# Patient Record
Sex: Female | Born: 1990 | Race: Black or African American | Hispanic: No | Marital: Single | Smoking: Never smoker
Health system: Southern US, Community
[De-identification: ages and names within clinical notes are randomized; demographics above are authoritative.]

## PROBLEM LIST (undated history)

## (undated) DIAGNOSIS — J45909 Unspecified asthma, uncomplicated: Secondary | ICD-10-CM

---

## 2020-12-22 ENCOUNTER — Emergency Department (HOSPITAL_COMMUNITY)
Admission: EM | Admit: 2020-12-22 | Discharge: 2020-12-23 | Disposition: A | Discharge status: Home / Self Care | Payer: Medicaid - Out of State | Attending: Emergency Medicine | Admitting: Emergency Medicine

## 2020-12-22 ENCOUNTER — Encounter (HOSPITAL_COMMUNITY): Payer: Self-pay

## 2020-12-22 ENCOUNTER — Emergency Department (HOSPITAL_COMMUNITY): Payer: Medicaid - Out of State

## 2020-12-22 DIAGNOSIS — J45909 Unspecified asthma, uncomplicated: Secondary | ICD-10-CM | POA: Insufficient documentation

## 2020-12-22 DIAGNOSIS — K769 Liver disease, unspecified: Secondary | ICD-10-CM

## 2020-12-22 DIAGNOSIS — R1031 Right lower quadrant pain: Secondary | ICD-10-CM

## 2020-12-22 HISTORY — DX: Unspecified asthma, uncomplicated: J45.909

## 2020-12-22 LAB — URINALYSIS, ROUTINE W REFLEX MICROSCOPIC
Bacteria, UA: NONE SEEN
Bilirubin Urine: NEGATIVE
Glucose, UA: NEGATIVE mg/dL
Ketones, ur: NEGATIVE mg/dL
Nitrite: NEGATIVE
Protein, ur: 100 mg/dL — AB
RBC / HPF: >50 RBC/hpf — ABNORMAL HIGH (ref 0–5)
Specific Gravity, Urine: 1.017 (ref 1.005–1.030)
pH: 6 (ref 5.0–8.0)

## 2020-12-22 LAB — CBC
HCT: 34.2 % — ABNORMAL LOW (ref 36.0–46.0)
Hemoglobin: 11 g/dL — ABNORMAL LOW (ref 12.0–15.0)
MCH: 26.4 pg (ref 26.0–34.0)
MCHC: 32.2 g/dL (ref 30.0–36.0)
MCV: 82.2 fL (ref 80.0–100.0)
Platelets: 293 10*3/uL (ref 150–400)
RBC: 4.16 MIL/uL (ref 3.87–5.11)
RDW: 15.8 % — ABNORMAL HIGH (ref 11.5–15.5)
WBC: 6.6 10*3/uL (ref 4.0–10.5)
nRBC: 0 % (ref 0.0–0.2)

## 2020-12-22 LAB — COMPREHENSIVE METABOLIC PANEL
ALT: 24 U/L (ref 0–44)
AST: 17 U/L (ref 15–41)
Albumin: 3.3 g/dL — ABNORMAL LOW (ref 3.5–5.0)
Alkaline Phosphatase: 76 U/L (ref 38–126)
Anion gap: 7 (ref 5–15)
BUN: 12 mg/dL (ref 6–20)
CO2: 26 mmol/L (ref 22–32)
Calcium: 8.8 mg/dL — ABNORMAL LOW (ref 8.9–10.3)
Chloride: 106 mmol/L (ref 98–111)
Creatinine, Ser: 0.65 mg/dL (ref 0.44–1.00)
GFR, Estimated: >60 mL/min (ref >60)
Glucose, Bld: 96 mg/dL (ref 70–99)
Potassium: 3.7 mmol/L (ref 3.5–5.1)
Sodium: 139 mmol/L (ref 135–145)
Total Bilirubin: 0.4 mg/dL (ref 0.3–1.2)
Total Protein: 7.2 g/dL (ref 6.5–8.1)

## 2020-12-22 LAB — LIPASE, BLOOD: Lipase: 30 U/L (ref 11–51)

## 2020-12-22 LAB — I-STAT BETA HCG BLOOD, ED (MC, WL, AP ONLY): I-stat hCG, quantitative: <5 m[IU]/mL (ref <5)

## 2020-12-22 MED ORDER — IOHEXOL 300 MG/ML  SOLN
100.0000 mL | Freq: Once | INTRAMUSCULAR | Status: AC | PRN
  Administered 2020-12-22: 100 mL via INTRAVENOUS

## 2020-12-22 MED ORDER — SODIUM CHLORIDE 0.9 % IV BOLUS
1000.0000 mL | Freq: Once | INTRAVENOUS | Status: AC
  Administered 2020-12-22: 1000 mL via INTRAVENOUS

## 2020-12-22 MED ORDER — ONDANSETRON HCL 4 MG/2ML IJ SOLN
4.0000 mg | Freq: Once | INTRAMUSCULAR | Status: AC
  Administered 2020-12-22: 4 mg via INTRAVENOUS
  Filled 2020-12-22: qty 2

## 2020-12-22 MED ORDER — MORPHINE SULFATE (PF) 4 MG/ML IV SOLN
4.0000 mg | Freq: Once | INTRAVENOUS | Status: AC
  Administered 2020-12-22: 4 mg via INTRAVENOUS
  Filled 2020-12-22: qty 1

## 2020-12-22 NOTE — ED Provider Notes (Signed)
[facility]   CSN: [id number] Arrival date & time: 12/22/20  2131     History Chief Complaint  Patient presents with   Abdominal Pain    Krista Haney is a 30 y.o. female with no significant past medical history who presents for evaluation of abdominal pain.  Pain began 5 days ago.  Describes it as an aching, stabbing pain.  Pain worse with movement.  Pain is located to right upper quadrant, right lower quadrant.  Lower > upper abd pain. Has had some nausea without vomiting.  Recently lost 80 pounds by dieting and attempt to qualify for bariatric surgery.  She recently moved here from [place] and does not PCP follow-up.  States she has had persistent vaginal bleeding over the last month after she had an IUD placed.  She is sexually active with her significant other however and has no concerns for STDs, vaginal discharge.  She denies any pelvic pain.  No dysuria.  She rates her pain an 8.5/10.  She has been taking 1000 mg of ibuprofen twice daily for her pain.  She last took this yesterday.  She denies any prior abdominal surgeries.  Her last bowel movement was yesterday.  States she feels like she is "constipated."  She denies any melena or bright red blood per rectum.  States she typically has bowel movements 2-3 times daily.  Was able to eat a waffle at 9 PM tonight.  Pain does not radiate into her flanks.  She has never had any pain similar to this previously.  Denies additional aggravating or alleviating factors.  History obtained from patient and past medical records.  No interpreter used.      HPI     Past Medical History:  Diagnosis Date   Asthma     There are no problems to display for this patient.   History reviewed. No pertinent surgical history.   OB History   No obstetric history on file.     History reviewed. No pertinent family history.  Social History   Tobacco Use   Smoking status: Never Smoker    Smokeless tobacco: Never Used    Home Medications Prior to Admission medications   Medication Sig Start Date End Date Taking? Authorizing Provider  albuterol (VENTOLIN HFA) 108 (90 Base) MCG/ACT inhaler Inhale 2 puffs into the lungs every 6 (six) hours as needed for wheezing or shortness of breath.   Yes [provider]  dicyclomine (BENTYL) 20 MG tablet Take 1 tablet (20 mg total) by mouth 2 (two) times daily. 12/23/20  Yes Aniyia Rane A, PA-C  levonorgestrel (MIRENA) 20 MCG/24HR IUD 1 each by Intrauterine route once. Placed Jan 2022   Yes [provider]  Multiple Vitamin (MULTIVITAMIN) tablet Take 1 tablet by mouth daily. Centrum 1 a day   Yes [provider]    Allergies    Patient has no known allergies.  Review of Systems   Review of Systems  Constitutional: Positive for activity change and appetite change.  HENT: Negative.   Respiratory: Negative.   Cardiovascular: Negative.   Gastrointestinal: Positive for abdominal pain, constipation and nausea. Negative for anal bleeding, blood in stool, diarrhea, rectal pain and vomiting.  Genitourinary: Positive for vaginal bleeding (x 1 month). Negative for flank pain, frequency, hematuria, menstrual problem, pelvic pain, urgency, vaginal discharge and vaginal pain.  Musculoskeletal: Negative.   Skin: Negative.   Neurological: Negative.   All other systems reviewed and are negative.  Physical Exam Updated Vital Signs BP 127/77    Pulse 65    Temp 97.8 F (36.6 C) (Oral)    Resp 17    Ht 5\' 3"  (1.6 m)    Wt 131.1 kg    LMP 12/15/2020 Comment: negative beta HCG 12/22/20, IUD in place, constant bleeding   SpO2 100%    BMI 51.19 kg/m   Physical Exam Vitals and nursing note reviewed.  Constitutional:      General: She is not in acute distress.    Appearance: She is well-developed and well-nourished. She is not ill-appearing, toxic-appearing or diaphoretic.  HENT:     Head: Normocephalic and atraumatic.      Mouth/Throat:     Mouth: Mucous membranes are moist.  Eyes:     Pupils: Pupils are equal, round, and reactive to light.  Cardiovascular:     Rate and Rhythm: Normal rate.     Pulses: Intact distal pulses.     Heart sounds: Normal heart sounds.  Pulmonary:     Effort: Pulmonary effort is normal. No respiratory distress.     Breath sounds: Normal breath sounds.  Abdominal:     General: Bowel sounds are normal. There is no distension.     Palpations: Abdomen is soft.     Tenderness: There is abdominal tenderness in the right upper quadrant and right lower quadrant. There is no right CVA tenderness, left CVA tenderness, guarding or rebound. Negative signs include Murphy's sign and McBurney's sign.     Hernia: No hernia is present.  Genitourinary:    Comments: Normal appearing external female genitalia without rashes or lesions, normal vaginal epithelium. Normal appearing cervix without discharge or petechiae. Cervical os is closed. There is no bleeding noted at the os. No odor. Bimanual: No CMT, nontender.  No palpable adnexal masses or tenderness. Uterus midline and not fixed. IUD string visualized. Rectovaginal exam was deferred.  No cystocele or rectocele noted. No pelvic lymphadenopathy noted. Wet prep was obtained.  Cultures for gonorrhea and chlamydia collected. Exam performed with chaperone in room. Musculoskeletal:        General: Normal range of motion.     Cervical back: Normal range of motion.  Skin:    General: Skin is warm and dry.     Capillary Refill: Capillary refill takes less than 2 seconds.  Neurological:     General: No focal deficit present.     Mental Status: She is alert.  Psychiatric:        Mood and Affect: Mood and affect normal.     ED Results / Procedures / Treatments   Labs (all labs ordered are listed, but only abnormal results are displayed) Labs Reviewed  COMPREHENSIVE METABOLIC PANEL - Abnormal; Notable for the following components:      Result  Value   Calcium 8.8 (*)    Albumin 3.3 (*)    All other components within normal limits  CBC - Abnormal; Notable for the following components:   Hemoglobin 11.0 (*)    HCT 34.2 (*)    RDW 15.8 (*)    All other components within normal limits  URINALYSIS, ROUTINE W REFLEX MICROSCOPIC - Abnormal; Notable for the following components:   Color, Urine RED (*)    APPearance HAZY (*)    Hgb urine dipstick MODERATE (*)    Protein, ur 100 (*)    Leukocytes,Ua SMALL (*)    RBC / HPF >50 (*)    All other components within normal limits  WET PREP, GENITAL  LIPASE, BLOOD  I-STAT BETA HCG BLOOD, ED ([facility], [facility], [facility] ONLY)  GC/CHLAMYDIA PROBE AMP ([place]) NOT AT [facility]    EKG None  Radiology CT Abdomen Pelvis W Contrast  Result Date: 12/23/2020 CLINICAL DATA:  Right lower quadrant pain. EXAM: CT ABDOMEN AND PELVIS WITH CONTRAST TECHNIQUE: Multidetector CT imaging of the abdomen and pelvis was performed using the standard protocol following bolus administration of intravenous contrast. CONTRAST:  100mL OMNIPAQUE IOHEXOL 300 MG/ML  SOLN COMPARISON:  None. FINDINGS: Lower chest: No acute abnormality. Hepatobiliary: An ill-defined, approximately 2.2 cm x 1.7 cm area of heterogeneous low attenuation is seen within the anterolateral aspect of the right lobe of the liver (axial CT image 18, CT series number 2). A similar appearing 2.5 cm x 1.5 cm area of low attenuation is seen within the posterior aspect of the right lobe (axial CT image 15, CT series number 2). No gallstones, gallbladder wall thickening, or biliary dilatation. Pancreas: Unremarkable. No pancreatic ductal dilatation or surrounding inflammatory changes. Spleen: Normal in size without focal abnormality. Adrenals/Urinary Tract: Adrenal glands are unremarkable. Kidneys are normal, without renal calculi, focal lesion, or hydronephrosis. Bladder is unremarkable. Stomach/Bowel: Stomach is within normal limits. Appendix appears normal. No evidence of  bowel wall thickening, distention, or inflammatory changes. Vascular/Lymphatic: No significant vascular findings are present. No enlarged abdominal or pelvic lymph nodes. Reproductive: An IUD is seen within an otherwise normal appearing uterus. The bilateral adnexa are unremarkable. Other: No abdominal wall hernia or abnormality. No abdominopelvic ascites. Musculoskeletal: No acute or significant osseous findings. IMPRESSION: 1. Low attenuation liver lesions, as described above, which probably represent hepatic hemangiomas. MRI correlation is recommended. 2. IUD within an otherwise normal appearing uterus. Electronically Signed   By: Aram Candelahaddeus  Houston M.D.   On: 12/23/2020 00:37   US PELVIC COMPLETE W TRANSVAGINAL AND TORSION R/O  Result Date: 12/23/2020 CLINICAL DATA:  Right lower quadrant pain. EXAM: TRANSABDOMINAL AND TRANSVAGINAL ULTRASOUND OF PELVIS DOPPLER ULTRASOUND OF OVARIES TECHNIQUE: Both transabdominal and transvaginal ultrasound examinations of the pelvis were performed. Transabdominal technique was performed for global imaging of the pelvis including uterus, ovaries, adnexal regions, and pelvic cul-de-sac. It was necessary to proceed with endovaginal exam following the transabdominal exam to visualize the uterus, endometrium, bilateral ovaries and bilateral adnexa. Color and duplex Doppler ultrasound was utilized to evaluate blood flow to the ovaries. COMPARISON:  None. FINDINGS: Uterus Measurements: 9.5 cm x 4.2 cm x 5.4 cm = volume: 113.74 mL. No fibroids or other mass visualized. Endometrium Thickness: 5 mm. An IUD is in place. No focal abnormality visualized. Right ovary Measurements: 4.4 cm x 2.6 cm x 3.1 cm = volume: 18.5 mL. Normal appearance/no adnexal mass. Left ovary Measurements: 3.9 cm x 2.3 cm x 2.2 cm = volume: 10.1 mL. Normal appearance/no adnexal mass. Pulsed Doppler evaluation of both ovaries demonstrates normal low-resistance arterial and venous waveforms. Other findings No  abnormal free fluid. IMPRESSION: Normal pelvic ultrasound. Electronically Signed   By: Aram Candelahaddeus  Houston M.D.   On: 12/23/2020 02:14    Procedures Procedures   Medications Ordered in ED Medications  sodium chloride 0.9 % bolus 1,000 mL (0 mLs Intravenous Stopped 12/23/20 0041)  ondansetron (ZOFRAN) injection 4 mg (4 mg Intravenous Given 12/22/20 2325)  morphine 4 MG/ML injection 4 mg (4 mg Intravenous Given 12/22/20 2325)  iohexol (OMNIPAQUE) 300 MG/ML solution 100 mL (100 mLs Intravenous Contrast Given 12/22/20 2359)  morphine 4 MG/ML injection 4 mg (4 mg Intravenous Given 12/23/20 0143)  ED Course  I have reviewed the triage vital signs and the nursing notes.  Pertinent labs & imaging results that were available during my care of the patient were reviewed by me and considered in my medical decision making (see chart for details).  30 year old, otherwise healthy presents for evaluation of diffuse right-sided abdominal pain over the last 5 days.  He is afebrile, nonseptic, non-ill-appearing.  Diffuse tenderness to right upper quadrant, right lower quadrant.  Does have some persistent vaginal bleeding over the last month after having IUD placed however denies any pelvic pain, urinary symptoms.  She denies any complaints for STDs.  We will plan on labs, imaging and reassess  Labs and imaging personally reviewed and interpreted:  CBC without leukocytosis, hemoglobin 11.0 patient states this is similar to prior UA with blood however on cycle, does not appear to be infected Pregnancy test negative Lipase 30 CMP without significant abnormality CT [facility] with liver lesions likely hemangiomas will need follow up outpatient MR. Patient made aware.  [place] without torsion, acute findings  Patient is nontoxic, nonseptic appearing, in no apparent distress.  Patient's pain and other symptoms adequately managed in emergency department.  Fluid bolus given.  Labs, imaging and vitals reviewed.  Patient does not  meet the SIRS or Sepsis criteria.  On repeat exam patient does not have a surgical abdomin and there are no peritoneal signs.  No indication of appendicitis, bowel obstruction, bowel perforation, cholecystitis, diverticulitis, PID, TOA, torsion or ectopic pregnancy.  Patient discharged home with symptomatic treatment and given strict instructions for follow-up with their primary care physician.  I have also discussed reasons to return immediately to the ER.  Patient expresses understanding and agrees with plan.     MDM Rules/Calculators/A&P                          Final Clinical Impression(s) / ED Diagnoses Final diagnoses:  RLQ abdominal pain  Liver lesion    Rx / DC Orders ED Discharge Orders         Ordered    dicyclomine (BENTYL) 20 MG tablet  2 times daily        12/23/20 0302           Angelo Prindle A, PA-C 12/23/20 0305    Shon Baton, MD 12/23/20 [phone]

## 2020-12-22 NOTE — ED Triage Notes (Signed)
Pt BIB [facility] from home with c/o right lower and upper quadrant pain that has been going on x5 days and has increased in pain today. Pt states she has experienced N/D today. Pt states she is apart of the Bariatric Program and has lost 80lbs. Pt states she had an IUD placed 11.5 mo ago. Pt is A&Ox4 and ambulatory to and from bathroom.

## 2020-12-22 NOTE — ED Notes (Signed)
Pt ambulatory to the bathroom on arrival, collected urine which was light pink with a 2cm blood clot in it.

## 2020-12-23 ENCOUNTER — Emergency Department (HOSPITAL_COMMUNITY): Payer: Medicaid - Out of State

## 2020-12-23 LAB — WET PREP, GENITAL
Clue Cells Wet Prep HPF POC: NONE SEEN
Sperm: NONE SEEN
Trich, Wet Prep: NONE SEEN
WBC, Wet Prep HPF POC: NONE SEEN
Yeast Wet Prep HPF POC: NONE SEEN

## 2020-12-23 LAB — GC/CHLAMYDIA PROBE AMP (~~LOC~~) NOT AT ARMC
Chlamydia: NEGATIVE
Comment: NEGATIVE
Comment: NORMAL
Neisseria Gonorrhea: NEGATIVE

## 2020-12-23 MED ORDER — MORPHINE SULFATE (PF) 4 MG/ML IV SOLN
4.0000 mg | Freq: Once | INTRAVENOUS | Status: AC
  Administered 2020-12-23: 4 mg via INTRAVENOUS
  Filled 2020-12-23: qty 1

## 2020-12-23 MED ORDER — DICYCLOMINE HCL 20 MG PO TABS: 20.0000 mg | ORAL_TABLET | Freq: Two times a day (BID) | ORAL | 0 refills | Status: DC

## 2020-12-23 NOTE — Discharge Instructions (Signed)
CT showed a possible liver lesion likely a hemangioma. Radiology is recommending follow up with Primary care to obtain an MR to clarify these lesions.  I have referred you outpatient for a recheck of your IUD. Please call to schedule an appointment.

## 2020-12-23 NOTE — ED Notes (Signed)
Assumed care of this patient. Vitals taken. A&Ox4. NAD. Family at bedside. PIV removed and discharged. Pt seen ambulating with steady gait.

## 2020-12-23 NOTE — ED Notes (Signed)
Pt continues to have visitor at the bedside, fortunately pt is in good spirits.

## 2020-12-23 NOTE — ED Notes (Signed)
Patient transported to CT 

## 2021-03-29 ENCOUNTER — Encounter (HOSPITAL_COMMUNITY): Payer: Self-pay

## 2021-03-29 ENCOUNTER — Emergency Department (HOSPITAL_COMMUNITY): Payer: Medicaid - Out of State

## 2021-03-29 ENCOUNTER — Other Ambulatory Visit: Payer: Self-pay

## 2021-03-29 ENCOUNTER — Emergency Department (HOSPITAL_COMMUNITY)
Admission: EM | Admit: 2021-03-29 | Discharge: 2021-03-29 | Disposition: A | Discharge status: Home / Self Care | Payer: Medicaid - Out of State | Attending: Emergency Medicine | Admitting: Emergency Medicine

## 2021-03-29 DIAGNOSIS — E282 Polycystic ovarian syndrome: Secondary | ICD-10-CM | POA: Diagnosis not present

## 2021-03-29 DIAGNOSIS — Z91010 Allergy to peanuts: Secondary | ICD-10-CM | POA: Diagnosis not present

## 2021-03-29 DIAGNOSIS — R1030 Lower abdominal pain, unspecified: Secondary | ICD-10-CM

## 2021-03-29 DIAGNOSIS — L309 Dermatitis, unspecified: Secondary | ICD-10-CM | POA: Diagnosis not present

## 2021-03-29 DIAGNOSIS — J45909 Unspecified asthma, uncomplicated: Secondary | ICD-10-CM | POA: Insufficient documentation

## 2021-03-29 LAB — WET PREP, GENITAL
Clue Cells Wet Prep HPF POC: NONE SEEN
Sperm: NONE SEEN
Trich, Wet Prep: NONE SEEN
Yeast Wet Prep HPF POC: NONE SEEN

## 2021-03-29 LAB — URINALYSIS, ROUTINE W REFLEX MICROSCOPIC
Bilirubin Urine: NEGATIVE
Glucose, UA: NEGATIVE mg/dL
Hgb urine dipstick: NEGATIVE
Ketones, ur: NEGATIVE mg/dL
Leukocytes,Ua: NEGATIVE
Nitrite: NEGATIVE
Protein, ur: NEGATIVE mg/dL
Specific Gravity, Urine: 1.025 (ref 1.005–1.030)
pH: 5 (ref 5.0–8.0)

## 2021-03-29 LAB — COMPREHENSIVE METABOLIC PANEL
ALT: 25 U/L (ref 0–44)
AST: 16 U/L (ref 15–41)
Albumin: 3.3 g/dL — ABNORMAL LOW (ref 3.5–5.0)
Alkaline Phosphatase: 72 U/L (ref 38–126)
Anion gap: 7 (ref 5–15)
BUN: 10 mg/dL (ref 6–20)
CO2: 28 mmol/L (ref 22–32)
Calcium: 8.8 mg/dL — ABNORMAL LOW (ref 8.9–10.3)
Chloride: 104 mmol/L (ref 98–111)
Creatinine, Ser: 0.68 mg/dL (ref 0.44–1.00)
GFR, Estimated: >60 mL/min (ref >60)
Glucose, Bld: 100 mg/dL — ABNORMAL HIGH (ref 70–99)
Potassium: 3.3 mmol/L — ABNORMAL LOW (ref 3.5–5.1)
Sodium: 139 mmol/L (ref 135–145)
Total Bilirubin: 0.4 mg/dL (ref 0.3–1.2)
Total Protein: 7.1 g/dL (ref 6.5–8.1)

## 2021-03-29 LAB — CBC
HCT: 38.8 % (ref 36.0–46.0)
Hemoglobin: 12.1 g/dL (ref 12.0–15.0)
MCH: 26 pg (ref 26.0–34.0)
MCHC: 31.2 g/dL (ref 30.0–36.0)
MCV: 83.3 fL (ref 80.0–100.0)
Platelets: 315 10*3/uL (ref 150–400)
RBC: 4.66 MIL/uL (ref 3.87–5.11)
RDW: 14.9 % (ref 11.5–15.5)
WBC: 6.8 10*3/uL (ref 4.0–10.5)
nRBC: 0 % (ref 0.0–0.2)

## 2021-03-29 LAB — LIPASE, BLOOD: Lipase: 50 U/L (ref 11–51)

## 2021-03-29 LAB — I-STAT BETA HCG BLOOD, ED (MC, WL, AP ONLY): I-stat hCG, quantitative: <5 m[IU]/mL (ref <5)

## 2021-03-29 MED ORDER — SODIUM CHLORIDE 0.9 % IV BOLUS
1000.0000 mL | Freq: Once | INTRAVENOUS | Status: AC
  Administered 2021-03-29: 1000 mL via INTRAVENOUS

## 2021-03-29 MED ORDER — PIMECROLIMUS 1 % EX CREA: TOPICAL_CREAM | Freq: Two times a day (BID) | CUTANEOUS | 0 refills | Status: AC

## 2021-03-29 MED ORDER — ALBUTEROL SULFATE HFA 108 (90 BASE) MCG/ACT IN AERS
2.0000 | INHALATION_SPRAY | Freq: Once | RESPIRATORY_TRACT | Status: AC
  Administered 2021-03-29: 2 via RESPIRATORY_TRACT
  Filled 2021-03-29: qty 6.7

## 2021-03-29 MED ORDER — AEROCHAMBER PLUS FLO-VU LARGE MISC
Status: AC
  Administered 2021-03-29: 1
  Filled 2021-03-29: qty 1

## 2021-03-29 MED ORDER — ALBUTEROL SULFATE HFA 108 (90 BASE) MCG/ACT IN AERS: 2.0000 | INHALATION_SPRAY | Freq: Four times a day (QID) | RESPIRATORY_TRACT | 0 refills | Status: AC | PRN

## 2021-03-29 MED ORDER — AEROCHAMBER Z-STAT PLUS/MEDIUM MISC
1.0000 | Freq: Once | Status: AC
  Filled 2021-03-29: qty 1

## 2021-03-29 MED ORDER — KETOROLAC TROMETHAMINE 30 MG/ML IJ SOLN
30.0000 mg | Freq: Once | INTRAMUSCULAR | Status: AC
  Administered 2021-03-29: 30 mg via INTRAVENOUS
  Filled 2021-03-29: qty 1

## 2021-03-29 NOTE — ED Triage Notes (Signed)
Pt reports suprapubic pain radiating to her lower back x3 weeks. Pt has hx of PCOS and has not had a menstrual cycle in a while.

## 2021-03-29 NOTE — ED Provider Notes (Signed)
[facility] EMERGENCY DEPARTMENT Provider Note   CSN: [id number] Arrival date & time: 03/29/21  0755     History Chief Complaint  Patient presents with  . Abdominal Pain  . Back Pain    Krista Haney is a 30 y.o. female.  Pt presents to the ED today with abdominal and back pain.  She said sx have been going on for 3 weeks.  She had a Mirena placed in January, but was removed in Feb.  She wants to get pregnant.  She has not had a period in about 3 weeks, but has a hx of PCOS and has irregular periods.  Pt denies any dysuria or vaginal d/c.  She denies f/c.        Past Medical History:  Diagnosis Date  . Asthma     There are no problems to display for this patient.   History reviewed. No pertinent surgical history.   OB History   No obstetric history on file.     History reviewed. No pertinent family history.  Social History   Tobacco Use  . Smoking status: Never Smoker  . Smokeless tobacco: Never Used    Home Medications Prior to Admission medications   Medication Sig Start Date End Date Taking? Authorizing Provider  montelukast (SINGULAIR) 10 MG tablet Take 10 mg by mouth daily. 10/13/20  Yes [provider]  Multiple Vitamin (MULTIVITAMIN) tablet Take 1 tablet by mouth daily. Centrum 1 a day   Yes [provider]  pimecrolimus (ELIDEL) 1 % cream Apply topically 2 (two) times daily. 03/29/21  Yes Jacalyn Lefevre, MD  albuterol (VENTOLIN HFA) 108 (90 Base) MCG/ACT inhaler Inhale 2 puffs into the lungs every 6 (six) hours as needed for wheezing or shortness of breath. 03/29/21   Jacalyn Lefevre, MD  dicyclomine (BENTYL) 20 MG tablet Take 1 tablet (20 mg total) by mouth 2 (two) times daily. Patient not taking: No sig reported 12/23/20   Henderly, Britni A, PA-C    Allergies    Peanut-containing drug products  Review of Systems   Review of Systems  Gastrointestinal: Positive for abdominal pain.  Musculoskeletal: Positive  for back pain.  All other systems reviewed and are negative.   Physical Exam Updated Vital Signs BP (!) 107/38   Pulse 90   Temp 98.6 F (37 C)   Resp (!) 21   SpO2 100%   Physical Exam Vitals and nursing note reviewed. Exam conducted with a chaperone present.  Constitutional:      Appearance: She is well-developed. She is obese.  HENT:     Head: Normocephalic and atraumatic.     Mouth/Throat:     Mouth: Mucous membranes are moist.  Eyes:     Extraocular Movements: Extraocular movements intact.     Pupils: Pupils are equal, round, and reactive to light.  Cardiovascular:     Rate and Rhythm: Normal rate and regular rhythm.     Heart sounds: Normal heart sounds.  Pulmonary:     Effort: Pulmonary effort is normal.     Breath sounds: Normal breath sounds.  Abdominal:     General: Abdomen is flat. Bowel sounds are normal.     Palpations: Abdomen is soft.     Tenderness: There is abdominal tenderness in the suprapubic area.  Genitourinary:    General: Normal vulva.     Exam position: Lithotomy position.     Vagina: Normal.     Cervix: Normal.     Uterus: Normal.  Adnexa:        Right: Tenderness present.        Left: Tenderness present.   Skin:    General: Skin is warm.     Capillary Refill: Capillary refill takes less than 2 seconds.     Comments: Eczema to both wrists  Neurological:     General: No focal deficit present.     Mental Status: She is alert and oriented to person, place, and time.  Psychiatric:        Mood and Affect: Mood normal.        Behavior: Behavior normal.     ED Results / Procedures / Treatments   Labs (all labs ordered are listed, but only abnormal results are displayed) Labs Reviewed  WET PREP, GENITAL - Abnormal; Notable for the following components:      Result Value   WBC, Wet Prep HPF POC MANY (*)    All other components within normal limits  COMPREHENSIVE METABOLIC PANEL - Abnormal; Notable for the following components:    Potassium 3.3 (*)    Glucose, Bld 100 (*)    Calcium 8.8 (*)    Albumin 3.3 (*)    All other components within normal limits  URINALYSIS, ROUTINE W REFLEX MICROSCOPIC - Abnormal; Notable for the following components:   APPearance CLOUDY (*)    All other components within normal limits  LIPASE, BLOOD  CBC  I-STAT BETA HCG BLOOD, ED ([facility], [facility], [facility] ONLY)  GC/CHLAMYDIA PROBE AMP ([place]) NOT AT [facility]    EKG None  Radiology US Transvaginal Non-OB  Result Date: 03/29/2021 CLINICAL DATA:  Pelvic pain EXAM: TRANSABDOMINAL AND TRANSVAGINAL ULTRASOUND OF PELVIS DOPPLER ULTRASOUND OF OVARIES TECHNIQUE: Study was performed transabdominally to optimize pelvic field of view evaluation and transvaginally to optimize internal visceral architecture evaluation. Color and duplex Doppler ultrasound was utilized to evaluate blood flow to the ovaries. COMPARISON:  None. FINDINGS: Uterus Measurements: 9.4 x 3.7 x 5.2 cm = volume: 94.1 mL. No fibroids or other mass visualized. Endometrium Thickness: 7 mm.  No focal abnormality visualized. Right ovary Measurements: 3.8 x 3.3 x 2.8 cm = volume: 18.5 mL. Normal appearance/no adnexal mass. Left ovary Measurements: 3.5 x 2.4 x 2.7 cm = volume: 11.5 mL. Normal appearance/no adnexal mass. Pulsed Doppler evaluation of both ovaries demonstrates normal low-resistance arterial and venous waveforms. Other findings No abnormal free fluid. IMPRESSION: Study within normal limits. No intrauterine or extrauterine pelvic or adnexal mass. Low resistance waveform in each ovary without evidence of torsion on either side. No free pelvic fluid. Electronically Signed   By: Bretta Bang III M.D.   On: 03/29/2021 10:59   US Pelvis Complete  Result Date: 03/29/2021 CLINICAL DATA:  Pelvic pain EXAM: TRANSABDOMINAL AND TRANSVAGINAL ULTRASOUND OF PELVIS DOPPLER ULTRASOUND OF OVARIES TECHNIQUE: Study was performed transabdominally to optimize pelvic field of view evaluation and  transvaginally to optimize internal visceral architecture evaluation. Color and duplex Doppler ultrasound was utilized to evaluate blood flow to the ovaries. COMPARISON:  None. FINDINGS: Uterus Measurements: 9.4 x 3.7 x 5.2 cm = volume: 94.1 mL. No fibroids or other mass visualized. Endometrium Thickness: 7 mm.  No focal abnormality visualized. Right ovary Measurements: 3.8 x 3.3 x 2.8 cm = volume: 18.5 mL. Normal appearance/no adnexal mass. Left ovary Measurements: 3.5 x 2.4 x 2.7 cm = volume: 11.5 mL. Normal appearance/no adnexal mass. Pulsed Doppler evaluation of both ovaries demonstrates normal low-resistance arterial and venous waveforms. Other findings No abnormal free fluid. IMPRESSION:  Study within normal limits. No intrauterine or extrauterine pelvic or adnexal mass. Low resistance waveform in each ovary without evidence of torsion on either side. No free pelvic fluid. Electronically Signed   By: Bretta Bang III M.D.   On: 03/29/2021 10:59   [place] Art/Ven Flow Abd Pelv Doppler  Result Date: 03/29/2021 CLINICAL DATA:  Pelvic pain EXAM: TRANSABDOMINAL AND TRANSVAGINAL ULTRASOUND OF PELVIS DOPPLER ULTRASOUND OF OVARIES TECHNIQUE: Study was performed transabdominally to optimize pelvic field of view evaluation and transvaginally to optimize internal visceral architecture evaluation. Color and duplex Doppler ultrasound was utilized to evaluate blood flow to the ovaries. COMPARISON:  None. FINDINGS: Uterus Measurements: 9.4 x 3.7 x 5.2 cm = volume: 94.1 mL. No fibroids or other mass visualized. Endometrium Thickness: 7 mm.  No focal abnormality visualized. Right ovary Measurements: 3.8 x 3.3 x 2.8 cm = volume: 18.5 mL. Normal appearance/no adnexal mass. Left ovary Measurements: 3.5 x 2.4 x 2.7 cm = volume: 11.5 mL. Normal appearance/no adnexal mass. Pulsed Doppler evaluation of both ovaries demonstrates normal low-resistance arterial and venous waveforms. Other findings No abnormal free fluid. IMPRESSION:  Study within normal limits. No intrauterine or extrauterine pelvic or adnexal mass. Low resistance waveform in each ovary without evidence of torsion on either side. No free pelvic fluid. Electronically Signed   By: Bretta Bang III M.D.   On: 03/29/2021 10:59    Procedures Procedures   Medications Ordered in ED Medications  albuterol (VENTOLIN HFA) 108 (90 Base) MCG/ACT inhaler 2 puff (has no administration in time range)  aerochamber Z-Stat Plus/medium 1 each (has no administration in time range)  sodium chloride 0.9 % bolus 1,000 mL (1,000 mLs Intravenous New Bag/Given 03/29/21 0900)  ketorolac (TORADOL) 30 MG/ML injection 30 mg (30 mg Intravenous Given 03/29/21 1050)    ED Course  I have reviewed the triage vital signs and the nursing notes.  Pertinent labs & imaging results that were available during my care of the patient were reviewed by me and considered in my medical decision making (see chart for details).    MDM Rules/Calculators/A&P                         Pt requested some cream for her eczema.  She can't use steroid cream as it makes it worse.  I will try Elidel.  She also requested a refill of her albuterol inhaler.  She is from [place], but is living here.  She is encouraged to establish care with a pcp.  [place] neg.  Pt's labs are nl.  She is stable for d/c.  She is to f/u with obgyn.  Return if worse.   Final Clinical Impression(s) / ED Diagnoses Final diagnoses:  Lower abdominal pain  PCOS (polycystic ovarian syndrome)  Eczema, unspecified type    Rx / DC Orders ED Discharge Orders         Ordered    pimecrolimus (ELIDEL) 1 % cream  2 times daily        03/29/21 1119    albuterol (VENTOLIN HFA) 108 (90 Base) MCG/ACT inhaler  Every 6 hours PRN        03/29/21 1119           Jacalyn Lefevre, MD 03/29/21 1121

## 2021-03-30 LAB — GC/CHLAMYDIA PROBE AMP (~~LOC~~) NOT AT ARMC
Chlamydia: NEGATIVE
Comment: NEGATIVE
Comment: NORMAL
Neisseria Gonorrhea: NEGATIVE

## 2021-05-30 ENCOUNTER — Emergency Department (HOSPITAL_COMMUNITY): Payer: Medicaid - Out of State

## 2021-05-30 ENCOUNTER — Emergency Department (HOSPITAL_COMMUNITY)
Admission: EM | Admit: 2021-05-30 | Discharge: 2021-05-30 | Disposition: A | Discharge status: Home / Self Care | Payer: Medicaid - Out of State | Attending: Emergency Medicine | Admitting: Emergency Medicine

## 2021-05-30 ENCOUNTER — Encounter (HOSPITAL_COMMUNITY): Payer: Self-pay | Admitting: *Deleted

## 2021-05-30 ENCOUNTER — Other Ambulatory Visit: Payer: Self-pay

## 2021-05-30 DIAGNOSIS — Z91010 Allergy to peanuts: Secondary | ICD-10-CM | POA: Insufficient documentation

## 2021-05-30 DIAGNOSIS — J45909 Unspecified asthma, uncomplicated: Secondary | ICD-10-CM | POA: Diagnosis not present

## 2021-05-30 DIAGNOSIS — R1084 Generalized abdominal pain: Secondary | ICD-10-CM | POA: Diagnosis not present

## 2021-05-30 DIAGNOSIS — N938 Other specified abnormal uterine and vaginal bleeding: Secondary | ICD-10-CM | POA: Diagnosis not present

## 2021-05-30 DIAGNOSIS — R102 Pelvic and perineal pain: Secondary | ICD-10-CM | POA: Diagnosis present

## 2021-05-30 LAB — BASIC METABOLIC PANEL
Anion gap: 8 (ref 5–15)
BUN: 10 mg/dL (ref 6–20)
CO2: 26 mmol/L (ref 22–32)
Calcium: 9.2 mg/dL (ref 8.9–10.3)
Chloride: 103 mmol/L (ref 98–111)
Creatinine, Ser: 0.71 mg/dL (ref 0.44–1.00)
GFR, Estimated: >60 mL/min (ref >60)
Glucose, Bld: 107 mg/dL — ABNORMAL HIGH (ref 70–99)
Potassium: 3.9 mmol/L (ref 3.5–5.1)
Sodium: 137 mmol/L (ref 135–145)

## 2021-05-30 LAB — WET PREP, GENITAL
Clue Cells Wet Prep HPF POC: NONE SEEN
Sperm: NONE SEEN
Trich, Wet Prep: NONE SEEN
Yeast Wet Prep HPF POC: NONE SEEN

## 2021-05-30 LAB — URINALYSIS, ROUTINE W REFLEX MICROSCOPIC
Bilirubin Urine: NEGATIVE
Glucose, UA: NEGATIVE mg/dL
Hgb urine dipstick: NEGATIVE
Ketones, ur: NEGATIVE mg/dL
Leukocytes,Ua: NEGATIVE
Nitrite: NEGATIVE
Protein, ur: NEGATIVE mg/dL
Specific Gravity, Urine: 1.019 (ref 1.005–1.030)
pH: 5 (ref 5.0–8.0)

## 2021-05-30 LAB — CBC WITH DIFFERENTIAL/PLATELET
Abs Immature Granulocytes: 0.02 10*3/uL (ref 0.00–0.07)
Basophils Absolute: 0 10*3/uL (ref 0.0–0.1)
Basophils Relative: 0 %
Eosinophils Absolute: 0.5 10*3/uL (ref 0.0–0.5)
Eosinophils Relative: 6 %
HCT: 38.8 % (ref 36.0–46.0)
Hemoglobin: 12.1 g/dL (ref 12.0–15.0)
Immature Granulocytes: 0 %
Lymphocytes Relative: 38 %
Lymphs Abs: 3.1 10*3/uL (ref 0.7–4.0)
MCH: 25.8 pg — ABNORMAL LOW (ref 26.0–34.0)
MCHC: 31.2 g/dL (ref 30.0–36.0)
MCV: 82.7 fL (ref 80.0–100.0)
Monocytes Absolute: 0.8 10*3/uL (ref 0.1–1.0)
Monocytes Relative: 10 %
Neutro Abs: 3.7 10*3/uL (ref 1.7–7.7)
Neutrophils Relative %: 46 %
Platelets: 326 10*3/uL (ref 150–400)
RBC: 4.69 MIL/uL (ref 3.87–5.11)
RDW: 15.2 % (ref 11.5–15.5)
WBC: 8.2 10*3/uL (ref 4.0–10.5)
nRBC: 0 % (ref 0.0–0.2)

## 2021-05-30 LAB — I-STAT BETA HCG BLOOD, ED (MC, WL, AP ONLY): I-stat hCG, quantitative: <5 m[IU]/mL (ref <5)

## 2021-05-30 MED ORDER — ONDANSETRON 4 MG PO TBDP: 4.0000 mg | ORAL_TABLET | Freq: Three times a day (TID) | ORAL | 0 refills | Status: AC | PRN

## 2021-05-30 MED ORDER — DICYCLOMINE HCL 20 MG PO TABS: 20.0000 mg | ORAL_TABLET | Freq: Two times a day (BID) | ORAL | 0 refills | Status: AC

## 2021-05-30 MED ORDER — DICYCLOMINE HCL 10 MG PO CAPS
20.0000 mg | ORAL_CAPSULE | Freq: Once | ORAL | Status: AC
  Administered 2021-05-30: 20 mg via ORAL
  Filled 2021-05-30: qty 2

## 2021-05-30 MED ORDER — ONDANSETRON 4 MG PO TBDP
4.0000 mg | ORAL_TABLET | Freq: Once | ORAL | Status: AC
  Administered 2021-05-30: 4 mg via ORAL
  Filled 2021-05-30: qty 1

## 2021-05-30 NOTE — Discharge Instructions (Addendum)
Please call the phone number provided with to schedule appointment with an OB/GYN.  Please follow-up with your primary care provider.  I have also given you the information for a gastroenterology group here in [place].  Please drink plenty of water.  Please avoid any alcohol or any recreational drugs.  I also prescribed you Bentyl to use as needed for abdominal pain and Zofran to use for nausea as needed.  You may always return to the ER for any new or concerning symptoms.

## 2021-05-30 NOTE — ED Provider Notes (Signed)
Emergency Medicine Provider Triage Evaluation Note  Krista Haney , a 30 y.o. female  was evaluated in triage.  Pt complains of pelvic cramps.  Reports associated vaginal bleeding.  Is due to start her menstrual cycle.  Denies any fevers or chills.  States the cramping is what brought her to the emergency department.  Reports at home negative pregnancy test..  Review of Systems  Positive: Pelvic cramps, vaginal bleeding Negative: Fever, chills  Physical Exam  BP 108/78 (BP Location: Left Arm)   Pulse 92   Temp 98.9 F (37.2 C) (Oral)   Resp 20   SpO2 98%  Gen:   Awake, no distress   Resp:  Normal effort  MSK:   Moves extremities without difficulty  Other:    Medical Decision Making  Medically screening exam initiated at 5:15 AM.  Appropriate orders placed.  Krista Haney was informed that the remainder of the evaluation will be completed by another provider, this initial triage assessment does not replace that evaluation, and the importance of remaining in the ED until their evaluation is complete.  Pelvic cramps   Roxy Horseman, PA-C 05/30/21 0516    Gilda Crease, MD 05/30/21 [phone]

## 2021-05-30 NOTE — ED Triage Notes (Signed)
C/o lower abd. Pain onset 1 week ago, states she thought she was getting ready to start her period. States her periods are irregular and she started bleeding yest, however no bleeding today just pain.c/o nausea no vomiting.

## 2021-05-30 NOTE — ED Provider Notes (Signed)
[facility] EMERGENCY DEPARTMENT Provider Note   CSN: [id number] Arrival date & time: 05/30/21  0501     History Chief Complaint  Patient presents with   Abdominal Pain    Krista Haney is a 30 y.o. female.  HPI Patient is a 30 year old female with past medical history significant for asthma and dysfunctional uterine bleeding.  She presented to the ER today states that she has had pain for 5 days now.  She states that it is crampy intermittent seems to be associated with some vaginal bleeding that she has had.  She states that is been on and off.  She states she does not have any vaginal bleeding currently.  She states that she took at home for his adjustment was negative.  She states that she is sexually monogamous with only 1 partner.  She denies any fevers chills nausea vomiting abnormal vaginal discharge or signs of bleeding.  She states that her periods tend to be 2 or 3 months apart.  They are very regular.  She states that she does not have an OB/GYN currently.  She has not taken anything for the cramping pain. She denies any diarrhea or vomiting but does endorse some mild nausea.  Urinary frequency urgency or hematuria.  No other associate symptoms.  No aggravating factors.    Past Medical History:  Diagnosis Date   Asthma     There are no problems to display for this patient.   History reviewed. No pertinent surgical history.   OB History   No obstetric history on file.     No family history on file.  Social History   Tobacco Use   Smoking status: Never   Smokeless tobacco: Never  Substance Use Topics   Alcohol use: Not Currently   Drug use: Never    Home Medications Prior to Admission medications   Medication Sig Start Date End Date Taking? Authorizing Provider  dicyclomine (BENTYL) 20 MG tablet Take 1 tablet (20 mg total) by mouth 2 (two) times daily. 05/30/21  Yes Parrish Bonn S, PA  ondansetron (ZOFRAN ODT) 4 MG disintegrating  tablet Take 1 tablet (4 mg total) by mouth every 8 (eight) hours as needed for nausea or vomiting. 05/30/21  Yes Kerin Cecchi S, PA  albuterol (VENTOLIN HFA) 108 (90 Base) MCG/ACT inhaler Inhale 2 puffs into the lungs every 6 (six) hours as needed for wheezing or shortness of breath. 03/29/21   Jacalyn Lefevre, MD  montelukast (SINGULAIR) 10 MG tablet Take 10 mg by mouth daily. 10/13/20   [provider]  Multiple Vitamin (MULTIVITAMIN) tablet Take 1 tablet by mouth daily. Centrum 1 a day    [provider]  pimecrolimus (ELIDEL) 1 % cream Apply topically 2 (two) times daily. 03/29/21   Jacalyn Lefevre, MD    Allergies    Peanut-containing drug products  Review of Systems   Review of Systems  Constitutional:  Negative for chills and fever.  HENT:  Negative for congestion.   Eyes:  Negative for pain.  Respiratory:  Negative for cough and shortness of breath.   Cardiovascular:  Negative for chest pain and leg swelling.  Gastrointestinal:  Positive for abdominal pain and nausea. Negative for diarrhea and vomiting.  Genitourinary:  Positive for vaginal bleeding. Negative for dysuria.  Musculoskeletal:  Negative for myalgias.  Skin:  Negative for rash.  Neurological:  Negative for dizziness and headaches.   Physical Exam Updated Vital Signs BP (!) 96/59   Pulse 62  Temp 98.2 F (36.8 C) (Oral)   Resp (!) 21   Ht 5\' 3"  (1.6 m)   Wt (!) 140.6 kg   SpO2 99%   BMI 54.91 kg/m   Physical Exam Vitals and nursing note reviewed.  Constitutional:      General: She is not in acute distress.    Appearance: She is obese.     Comments: Does not appear to be in any acute distress but does endorse discomfort.  Pleasant, able answer questions appropriately follow commands.  HENT:     Head: Normocephalic and atraumatic.     Nose: Nose normal.  Eyes:     General: No scleral icterus. Cardiovascular:     Rate and Rhythm: Normal rate and regular rhythm.     Pulses: Normal  pulses.     Heart sounds: Normal heart sounds.  Pulmonary:     Effort: Pulmonary effort is normal. No respiratory distress.     Breath sounds: No wheezing.  Abdominal:     Palpations: Abdomen is soft.     Tenderness: There is no abdominal tenderness. There is no guarding or rebound.     Comments: Abdomen is obese, nontender, no guarding or rebound.  No CVA tenderness.  Genitourinary:    Comments: Vulva without lesions or abnormality Vaginal canal without abnormal discharge or lesion Cervix appears normal, is closed No adnexal tenderness or CMT Musculoskeletal:     Cervical back: Normal range of motion.     Right lower leg: No edema.     Left lower leg: No edema.  Skin:    General: Skin is warm and dry.     Capillary Refill: Capillary refill takes less than 2 seconds.  Neurological:     Mental Status: She is alert. Mental status is at baseline.  Psychiatric:        Mood and Affect: Mood normal.        Behavior: Behavior normal.    ED Results / Procedures / Treatments   Labs (all labs ordered are listed, but only abnormal results are displayed) Labs Reviewed  WET PREP, GENITAL - Abnormal; Notable for the following components:      Result Value   WBC, Wet Prep HPF POC FEW (*)    All other components within normal limits  CBC WITH DIFFERENTIAL/PLATELET - Abnormal; Notable for the following components:   MCH 25.8 (*)    All other components within normal limits  BASIC METABOLIC PANEL - Abnormal; Notable for the following components:   Glucose, Bld 107 (*)    All other components within normal limits  URINALYSIS, ROUTINE W REFLEX MICROSCOPIC - Abnormal; Notable for the following components:   APPearance HAZY (*)    All other components within normal limits  I-STAT BETA HCG BLOOD, ED ([facility], [facility], [facility] ONLY)  GC/CHLAMYDIA PROBE AMP ([place]) NOT AT [facility]    EKG None  Radiology [facility] PELVIC COMPLETE W TRANSVAGINAL AND TORSION R/O  Result Date: 05/30/2021 CLINICAL DATA:   30 year old female with history of bilateral pelvic pain for the past 2 weeks. EXAM: TRANSABDOMINAL AND TRANSVAGINAL ULTRASOUND OF PELVIS DOPPLER ULTRASOUND OF OVARIES TECHNIQUE: Both transabdominal and transvaginal ultrasound examinations of the pelvis were performed. Transabdominal technique was performed for global imaging of the pelvis including uterus, ovaries, adnexal regions, and pelvic cul-de-sac. It was necessary to proceed with endovaginal exam following the transabdominal exam to visualize the ovaries. Color and duplex Doppler ultrasound was utilized to evaluate blood flow to the ovaries. COMPARISON:  Pelvic  ultrasound 03/29/2021. FINDINGS: Uterus Measurements: 8.0 x 3.7 x 5.2 cm = volume: 79.7 mL. No fibroids or other mass visualized. Endometrium Thickness: 9 mm.  No focal abnormality visualized. Right ovary Measurements: 3.5 x 3.2 x 2.8 cm = volume: 16.1 mL. Normal appearance/no adnexal mass. Left ovary Measurements: 3.4 x 2.3 x 1.6 cm = volume: 6.5 mL. Normal appearance/no adnexal mass. Pulsed Doppler evaluation of both ovaries demonstrates normal low-resistance arterial and venous waveforms. Other findings No abnormal free fluid. IMPRESSION: 1. No acute findings in the pelvis to account for the patient's symptoms. Electronically Signed   By: Trudie Reed M.D.   On: 05/30/2021 06:43    Procedures Procedures   Medications Ordered in ED Medications  dicyclomine (BENTYL) capsule 20 mg (20 mg Oral Given 05/30/21 0826)  ondansetron (ZOFRAN-ODT) disintegrating tablet 4 mg (4 mg Oral Given 05/30/21 9983)    ED Course  I have reviewed the triage vital signs and the nursing notes.  Pertinent labs & imaging results that were available during my care of the patient were reviewed by me and considered in my medical decision making (see chart for details).    MDM Rules/Calculators/A&P                           Patient is a 30 year old female presented today with pelvic pain ultrasound was  obtained in triage and was negative for torsion, cyst or any other acute abnormality.  CBC, BMP, urinalysis and wet prep ultimately unremarkable.  GC chlamydia pending.  Patient is physical exam is relatively unremarkable.  Overall she is well-appearing.  It seems the patient has had intermittent symptoms for 5 days now.  He has been associated bleeding.  Doubt ectopic given negative pregnancy test.  Doubt torsion given reassuring ultrasound and patient's symptoms being relatively mild during my evaluation of the patient.  Doubt intra-abdominal infection given the timeline of how long the symptoms been ongoing for and the lack of leukocytosis, peritoneal signs or discomfort on my examination.  Ultimately patient is quite well-appearing is not in any acute distress at this time.  Feels improved after Bentyl and Zofran.  Will give patient OB/GYN and GI follow-up recommendations and return precautions to the ER.  Tolerated p.o. without difficulty.  Ambulated without difficulty at time of discharge.  Final Clinical Impression(s) / ED Diagnoses Final diagnoses:  Pelvic pain  Generalized abdominal pain    Rx / DC Orders ED Discharge Orders          Ordered    dicyclomine (BENTYL) 20 MG tablet  2 times daily        05/30/21 0930    ondansetron (ZOFRAN ODT) 4 MG disintegrating tablet  Every 8 hours PRN        05/30/21 0930             Gailen Shelter, [place] 05/30/21 1405    Arby Barrette, MD 05/31/21 1114

## 2021-05-31 LAB — GC/CHLAMYDIA PROBE AMP (~~LOC~~) NOT AT ARMC
Chlamydia: NEGATIVE
Comment: NEGATIVE
Comment: NORMAL
Neisseria Gonorrhea: NEGATIVE

## 2022-12-16 IMAGING — US US PELVIS COMPLETE TRANSABD/TRANSVAG W DUPLEX
1 series · 13 of 25 positions shown · non-contrast
Comparison: None.

CLINICAL DATA: Right lower quadrant pain.

EXAM:
TRANSABDOMINAL AND TRANSVAGINAL ULTRASOUND OF PELVIS
DOPPLER ULTRASOUND OF OVARIES
TECHNIQUE: Both transabdominal and transvaginal ultrasound examinations of the
pelvis were performed. Transabdominal technique was performed for
global imaging of the pelvis including uterus, ovaries, adnexal
regions, and pelvic cul-de-sac.
It was necessary to proceed with endovaginal exam following the
transabdominal exam to visualize the uterus, endometrium, bilateral
ovaries and bilateral adnexa. Color and duplex Doppler ultrasound
was utilized to evaluate blood flow to the ovaries.

[Series 1: us pelvis complete transabd/transvag w duplex · 13 of 120 slices shown]
[im 1/120]
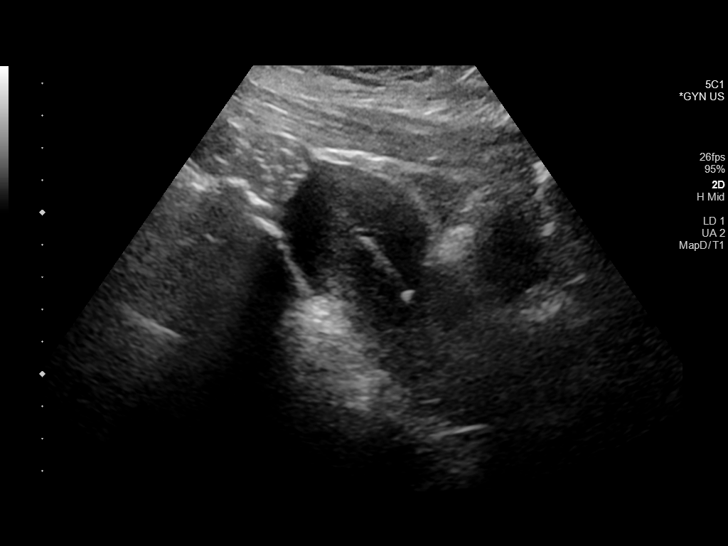
[im 10/120]
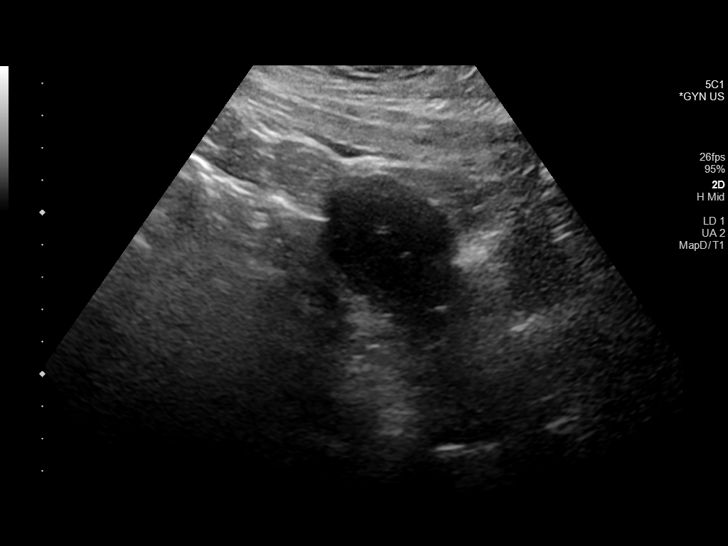
[im 20/120]
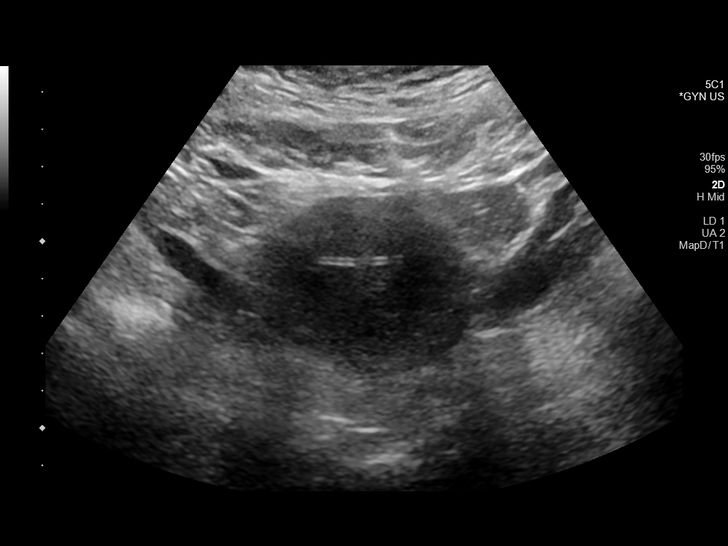
[im 30/120]
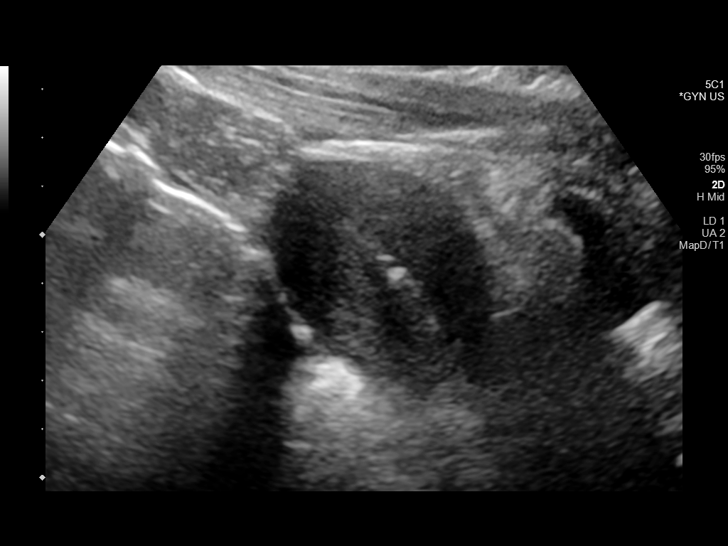
[im 40/120]
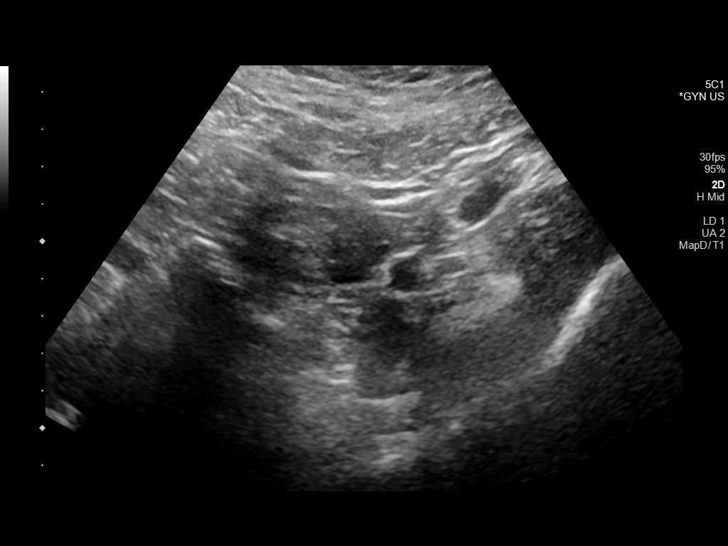
[im 50/120]
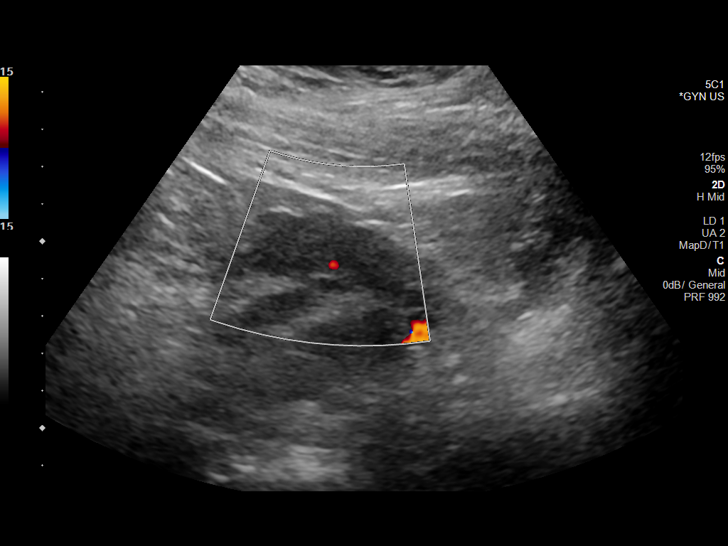
[im 60/120]
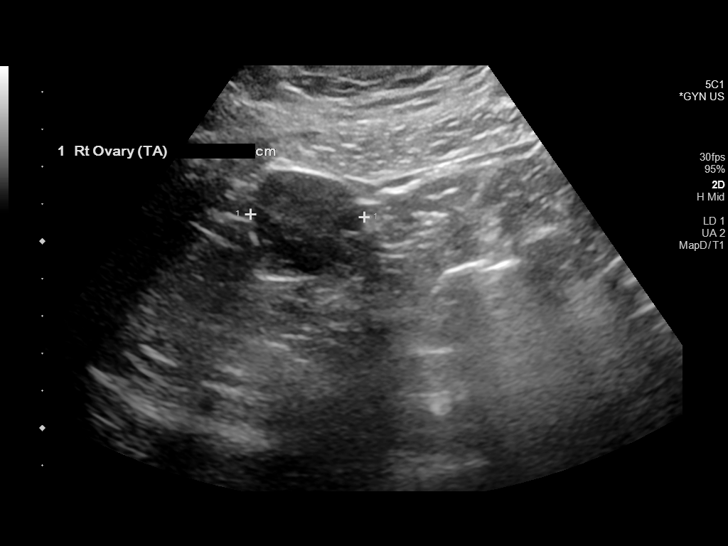
[im 70/120]
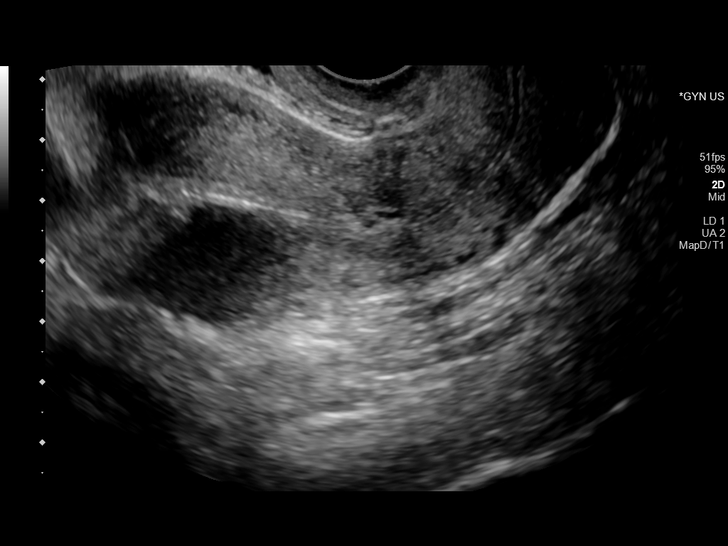
[im 80/120]
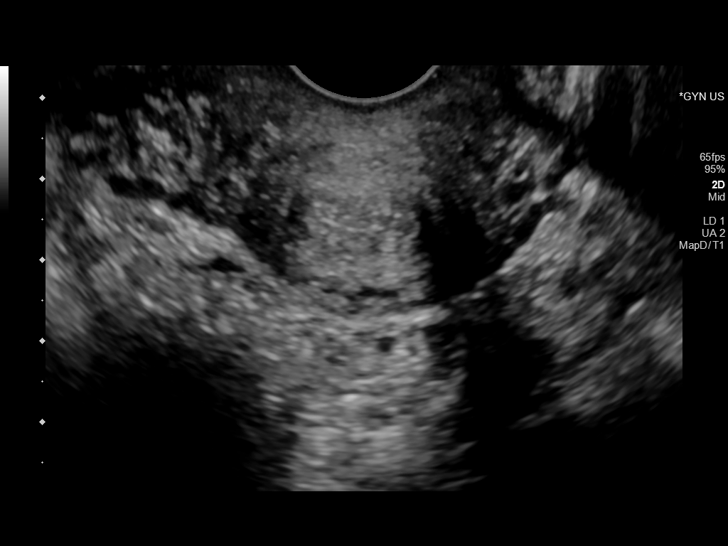
[im 90/120]
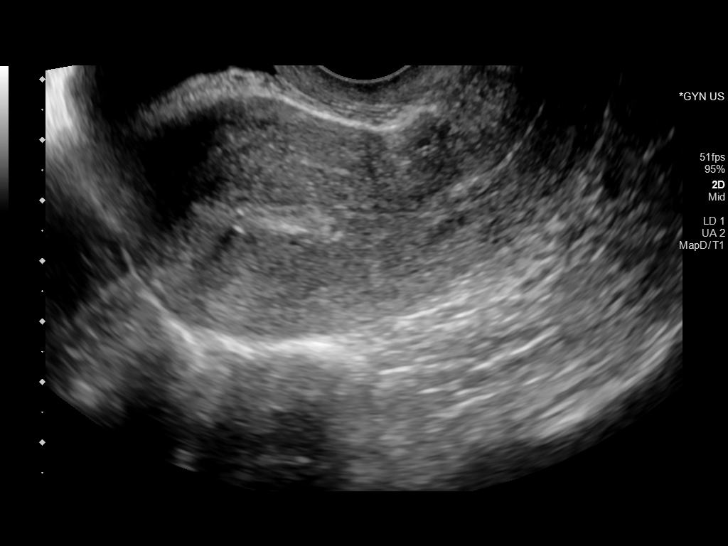
[im 100/120]
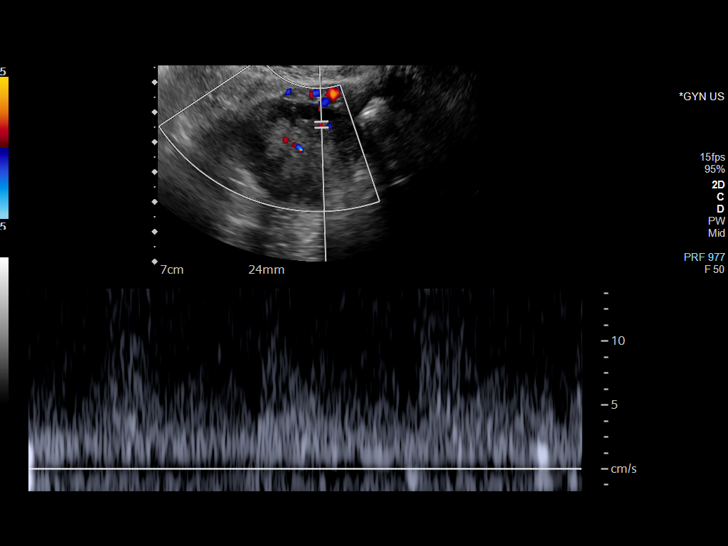
[im 110/120]
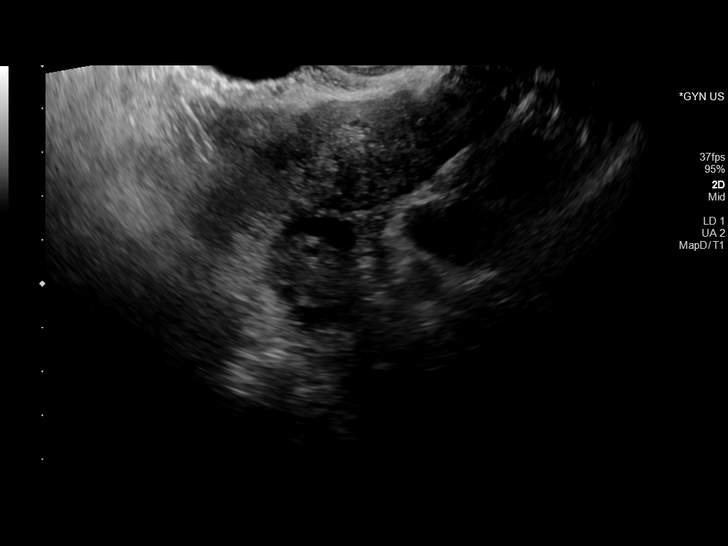
[im 120/120]
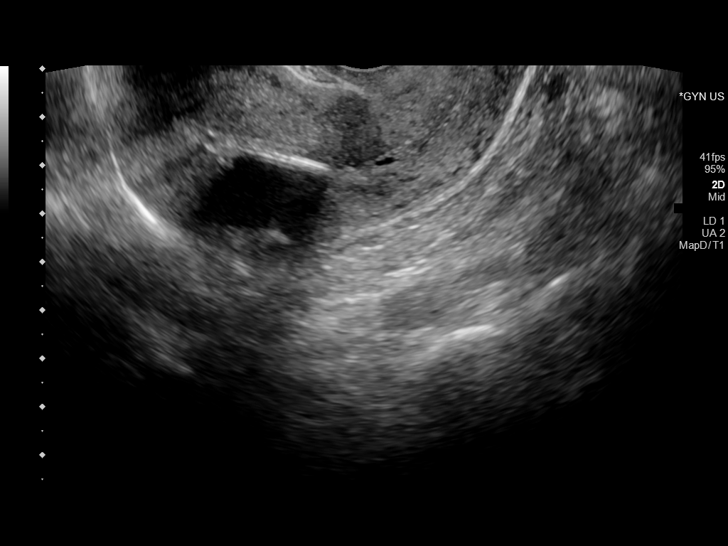

[13 of 25 positions shown; findings below may reference images not displayed]

FINDINGS: Uterus

Measurements: 9.5 cm x 4.2 cm x 5.4 cm = volume: 113.74 mL. No
fibroids or other mass visualized.

Endometrium

Thickness: 5 mm. An IUD is in place. No focal abnormality
visualized.

Right ovary

Measurements: 4.4 cm x 2.6 cm x 3.1 cm = volume: 18.5 mL. Normal
appearance/no adnexal mass.

Left ovary

Measurements: 3.9 cm x 2.3 cm x 2.2 cm = volume: 10.1 mL. Normal
appearance/no adnexal mass.

Pulsed Doppler evaluation of both ovaries demonstrates normal
low-resistance arterial and venous waveforms.

Other findings

No abnormal free fluid.
IMPRESSION: Normal pelvic ultrasound.

## 2023-03-22 IMAGING — US US TRANSVAGINAL NON-OB
1 series · 14 of 25 positions shown · non-contrast
Comparison: None.

CLINICAL DATA: Pelvic pain

EXAM:
TRANSABDOMINAL AND TRANSVAGINAL ULTRASOUND OF PELVIS
DOPPLER ULTRASOUND OF OVARIES
TECHNIQUE: Study was performed transabdominally to optimize pelvic field of
view evaluation and transvaginally to optimize internal visceral
architecture evaluation.
Color and duplex Doppler ultrasound was utilized to evaluate blood
flow to the ovaries.

[Series 1: us pelvic doppler (torsion right/o or mass arteria · arterial · 112 acquisitions, 14 frames shown]
[im 1/112]
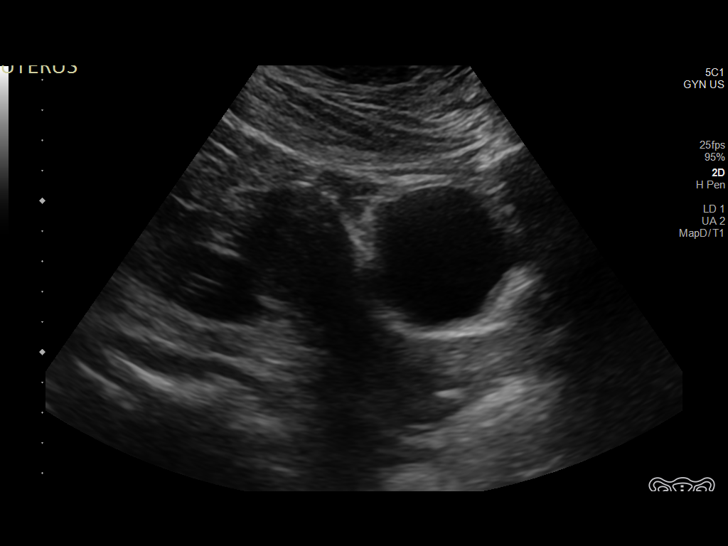
[im 10/112]
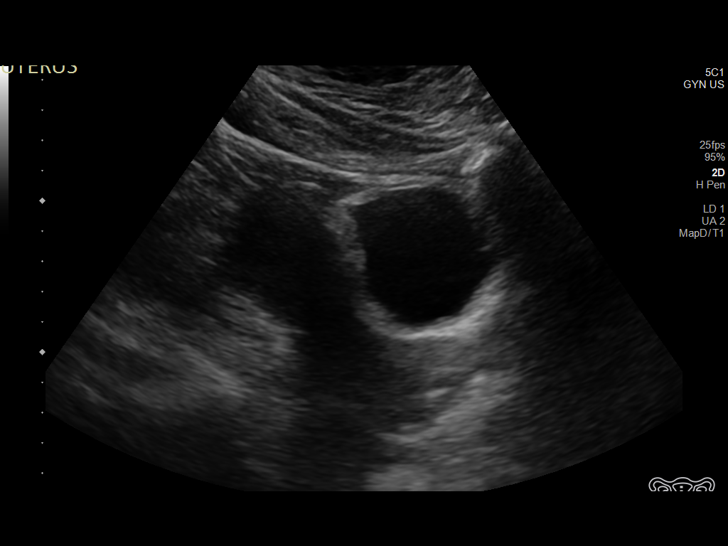
[im 19/112]
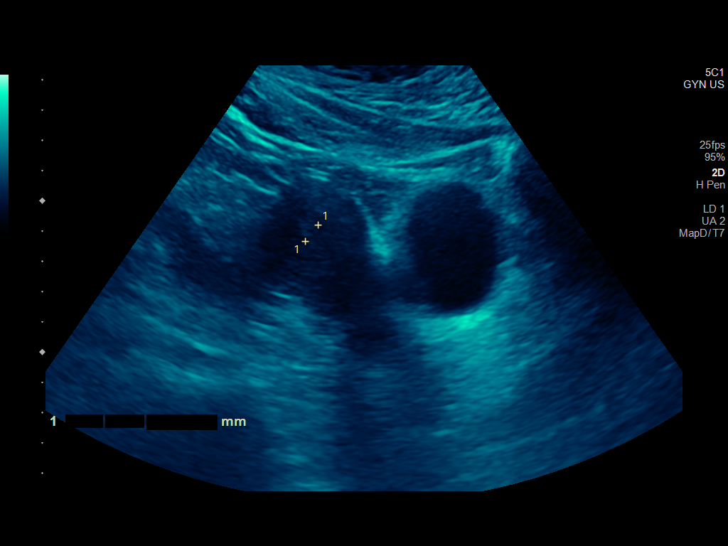
[im 28/112]
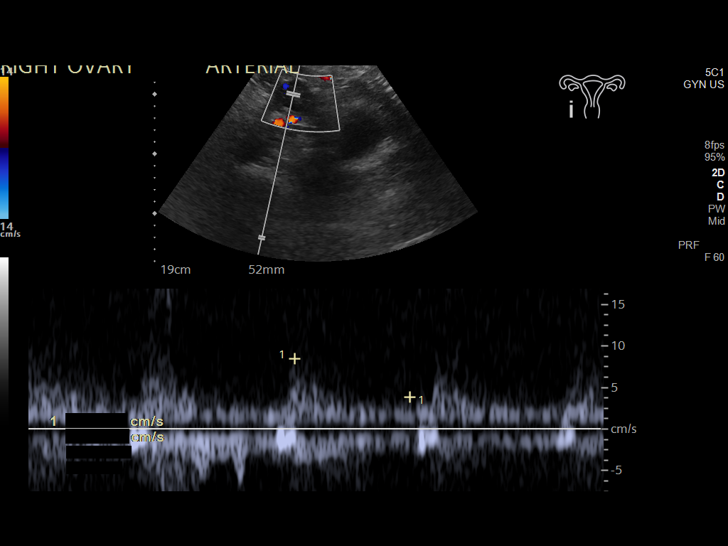
[im 38/112]
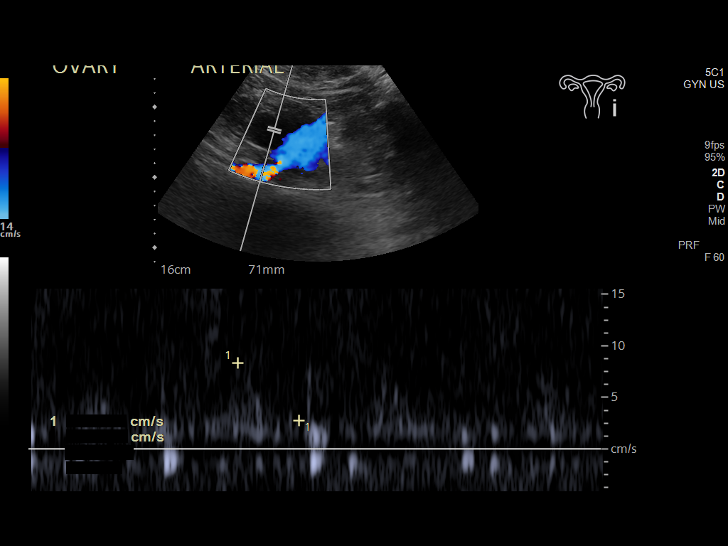
[im 42/112]
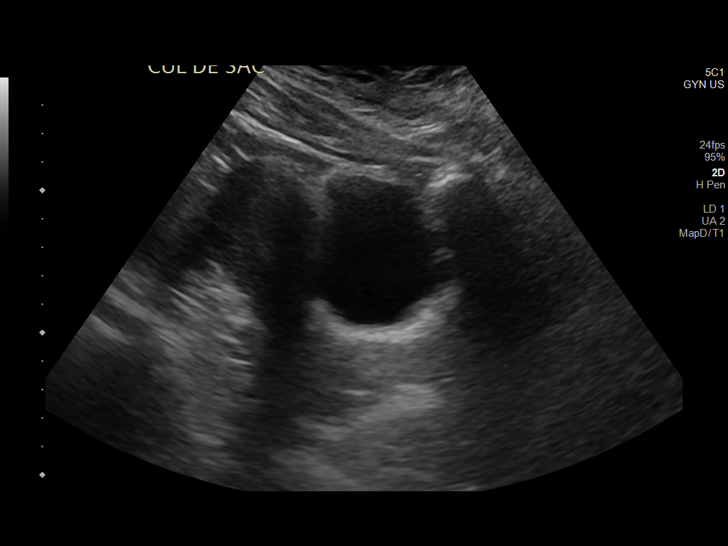
[im 51/112]
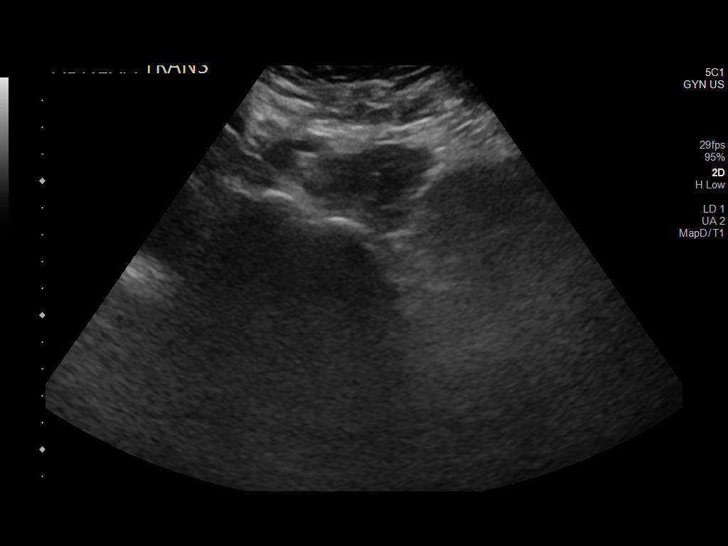
[im 61/112]
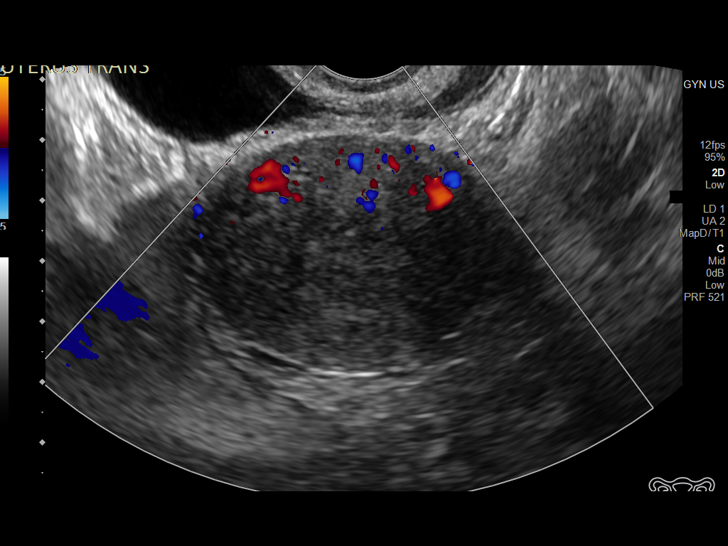
[im 70/112]
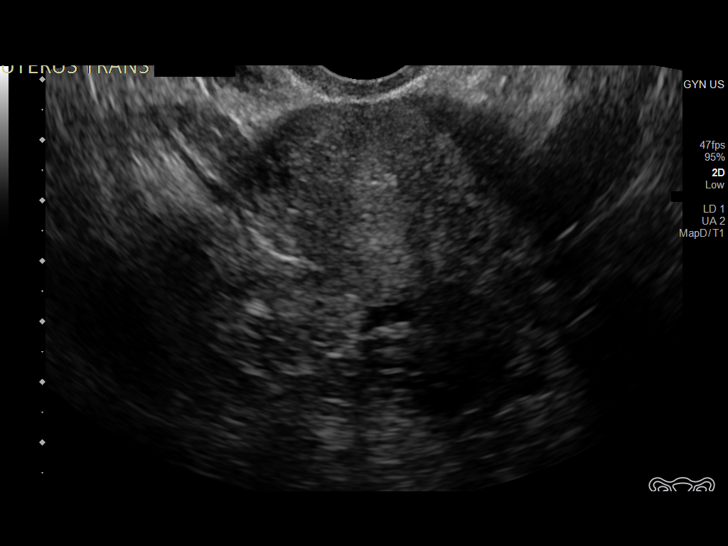
[im 75/112]
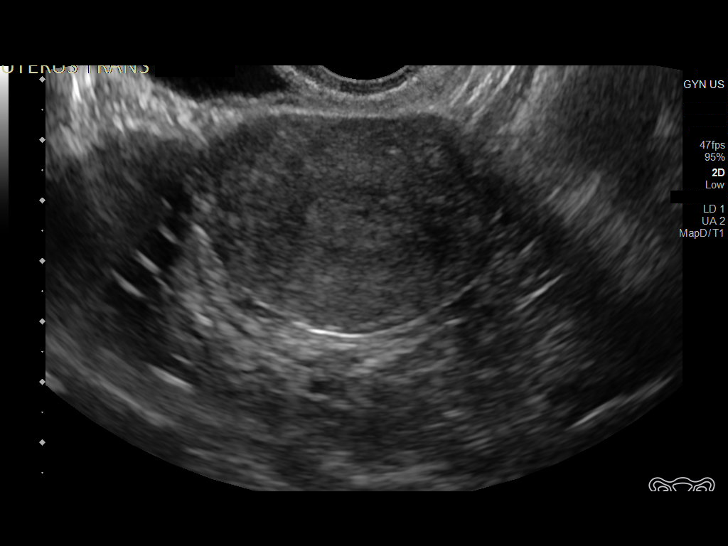
[im 84/112]
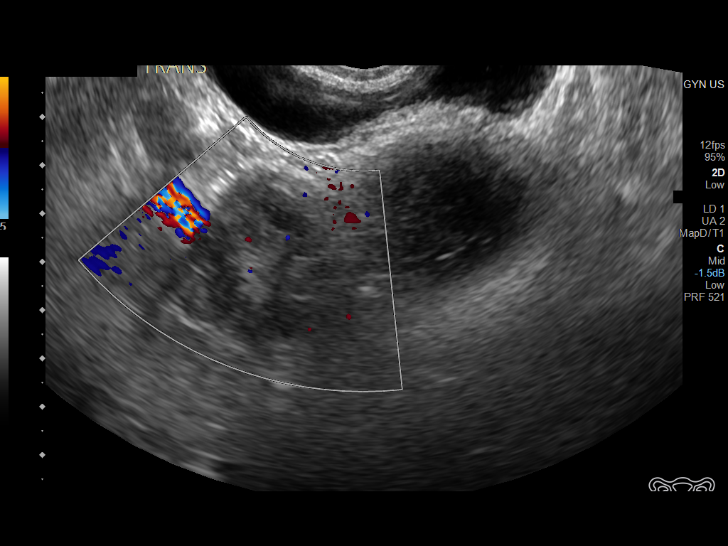
[im 93/112]
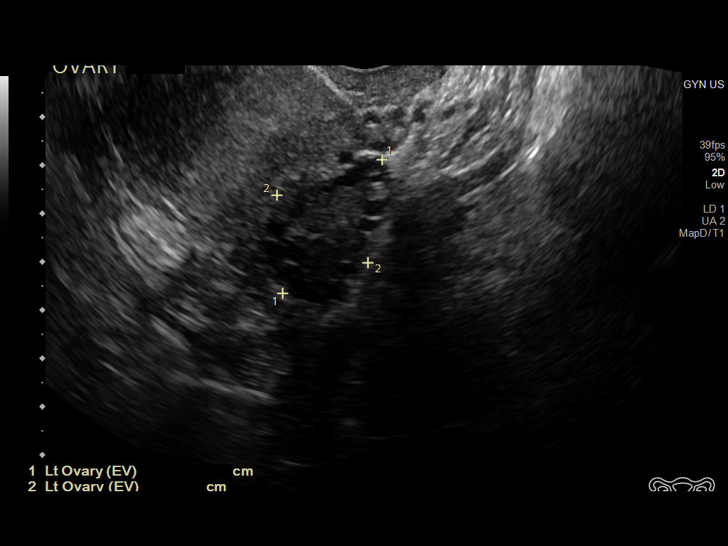
[im 102/112]
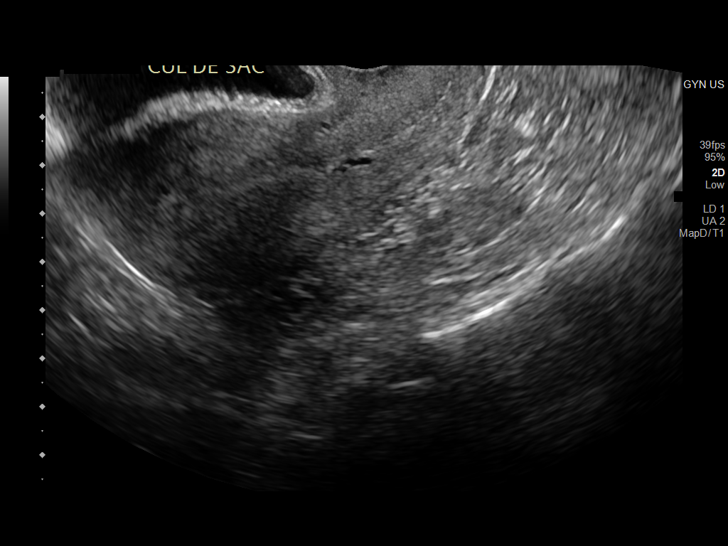
[im 112/112]
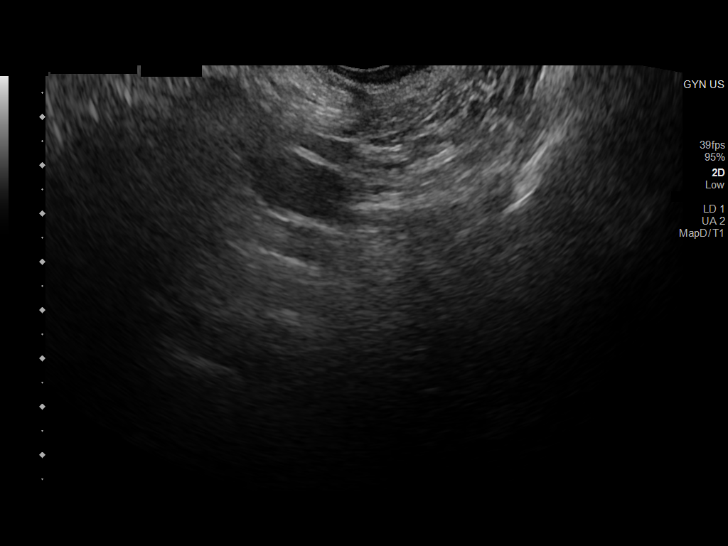

[14 of 25 positions shown; findings below may reference images not displayed]

FINDINGS: Uterus

Measurements: 9.4 x 3.7 x 5.2 cm = volume: 94.1 mL. No fibroids or
other mass visualized.

Endometrium

Thickness: 7 mm.  No focal abnormality visualized.

Right ovary

Measurements: 3.8 x 3.3 x 2.8 cm = volume: 18.5 mL. Normal
appearance/no adnexal mass.

Left ovary

Measurements: 3.5 x 2.4 x 2.7 cm = volume: 11.5 mL. Normal
appearance/no adnexal mass.

Pulsed Doppler evaluation of both ovaries demonstrates normal
low-resistance arterial and venous waveforms.

Other findings

No abnormal free fluid.
IMPRESSION: Study within normal limits. No intrauterine or extrauterine pelvic
or adnexal mass. Low resistance waveform in each ovary without
evidence of torsion on either side. No free pelvic fluid.

## 2023-03-22 IMAGING — US US ART/VEN ABD/PELV/SCROTUM DOPPLER LTD
1 series · 14 of 25 positions shown · non-contrast
Comparison: None.

CLINICAL DATA: Pelvic pain

EXAM:
TRANSABDOMINAL AND TRANSVAGINAL ULTRASOUND OF PELVIS
DOPPLER ULTRASOUND OF OVARIES
TECHNIQUE: Study was performed transabdominally to optimize pelvic field of
view evaluation and transvaginally to optimize internal visceral
architecture evaluation.
Color and duplex Doppler ultrasound was utilized to evaluate blood
flow to the ovaries.

[Series 1: us pelvic doppler (torsion right/o or mass arteria · arterial · 112 acquisitions, 14 frames shown]
[im 1/112]
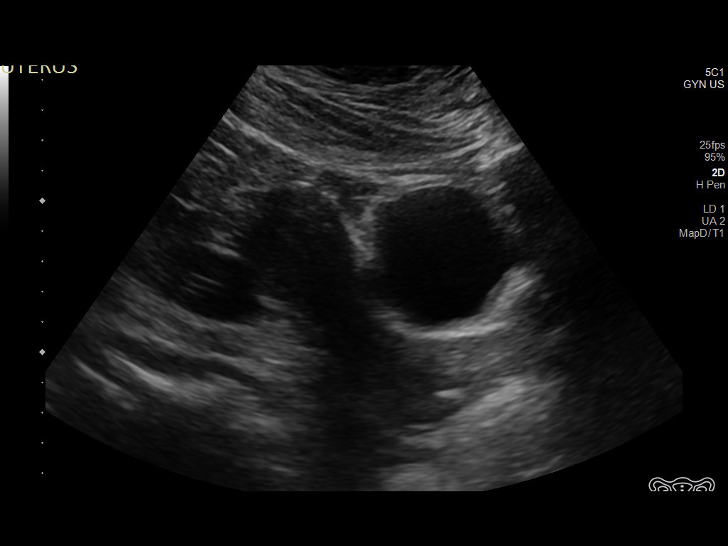
[im 10/112]
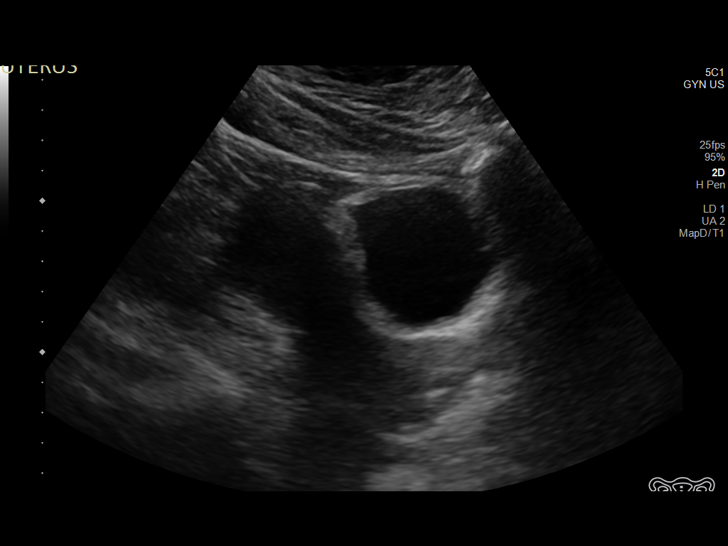
[im 19/112]
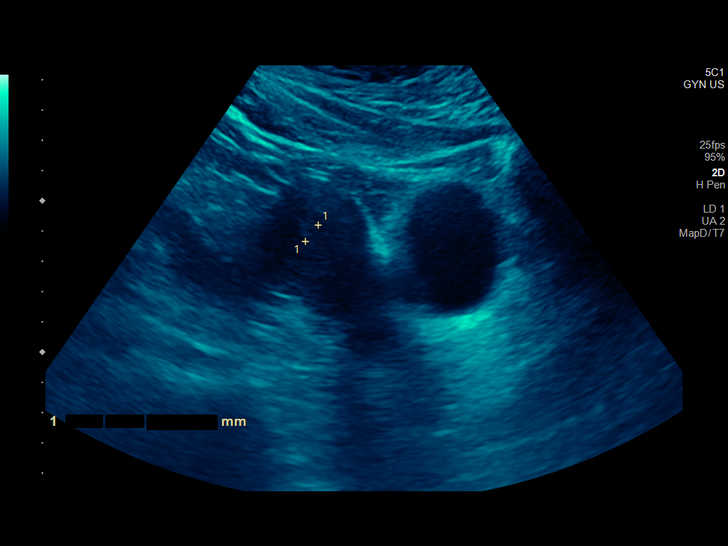
[im 28/112]
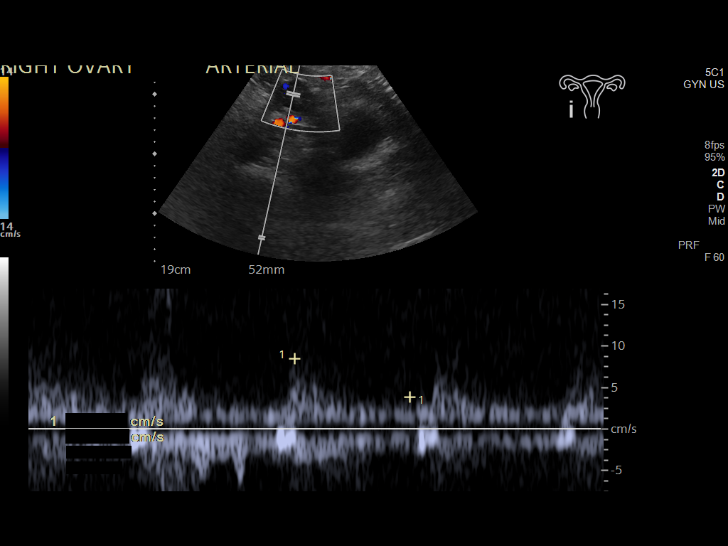
[im 38/112]
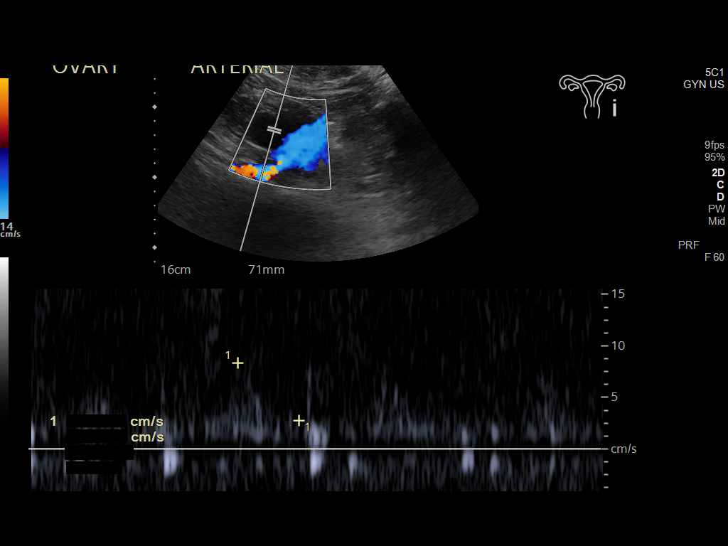
[im 42/112]
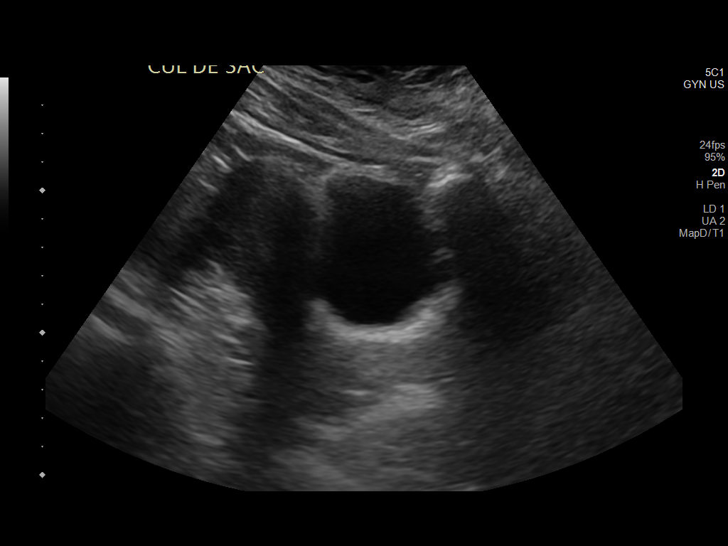
[im 51/112]
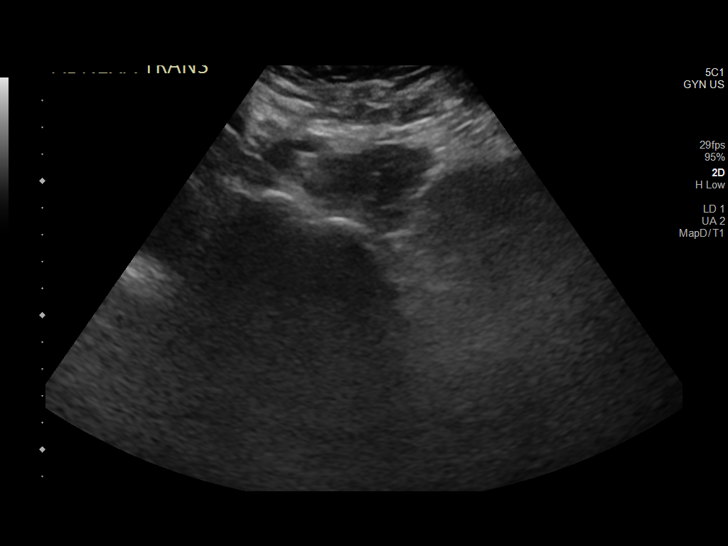
[im 61/112]
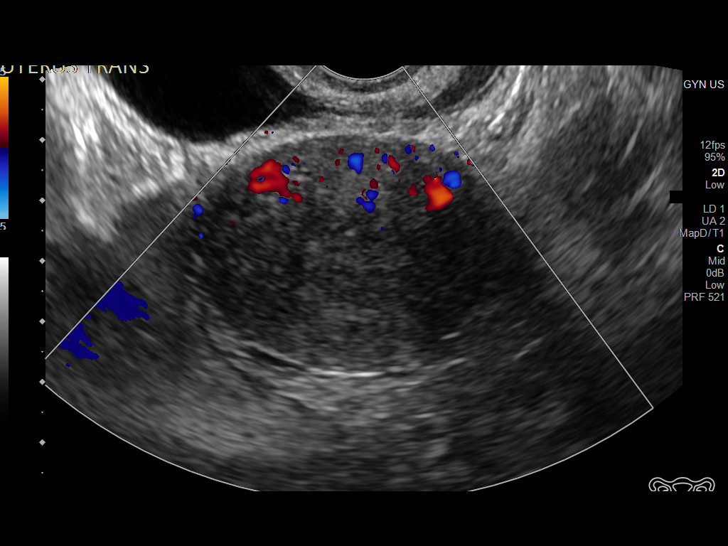
[im 70/112]
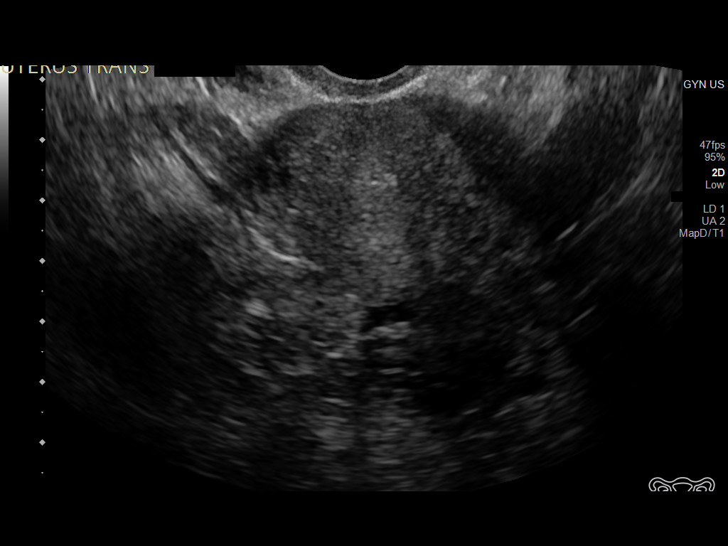
[im 75/112]
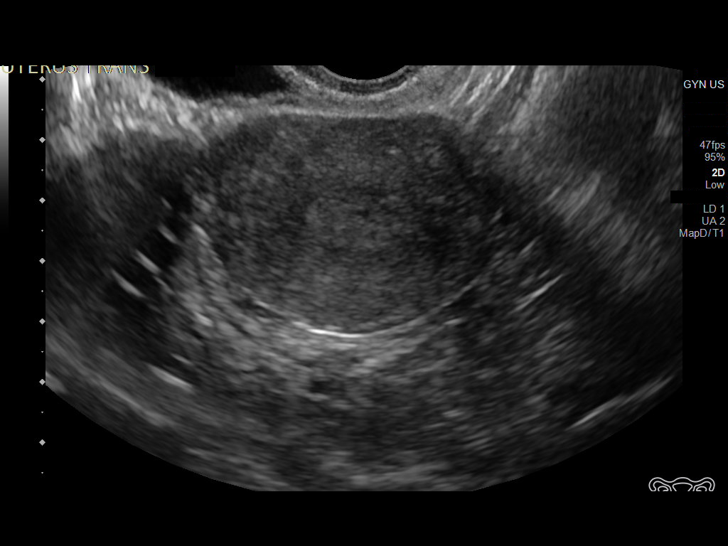
[im 84/112]
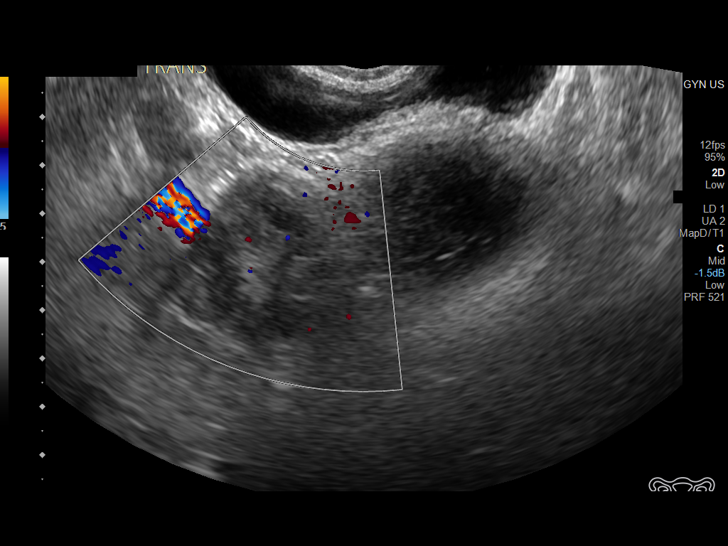
[im 93/112]
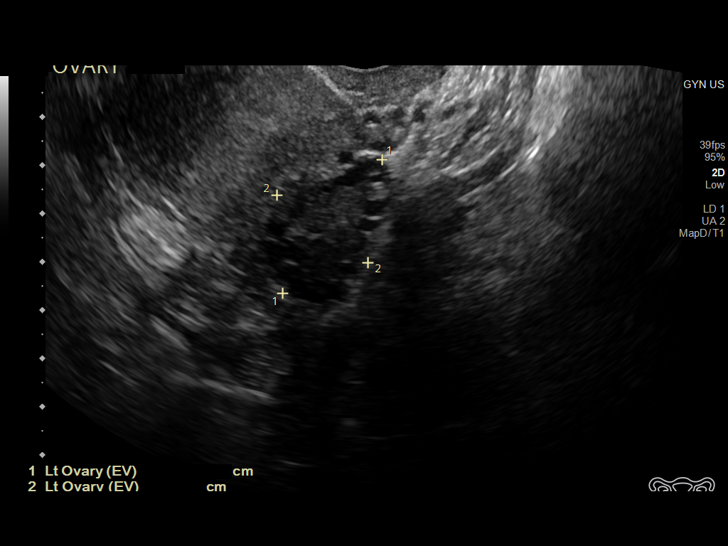
[im 102/112]
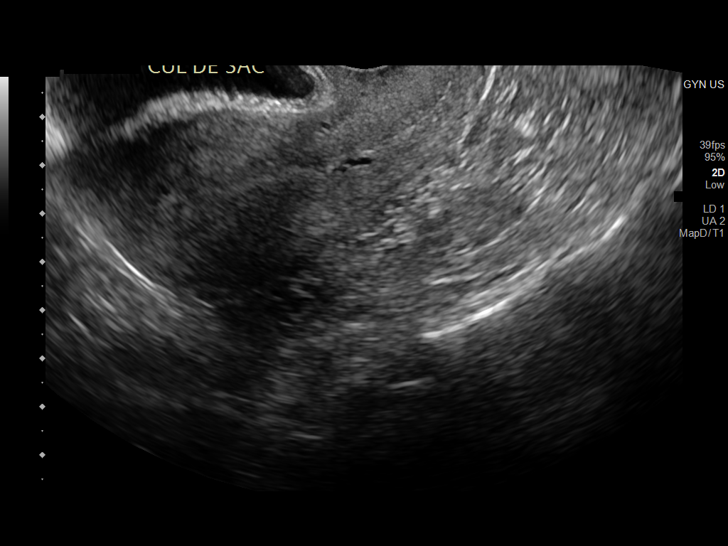
[im 112/112]
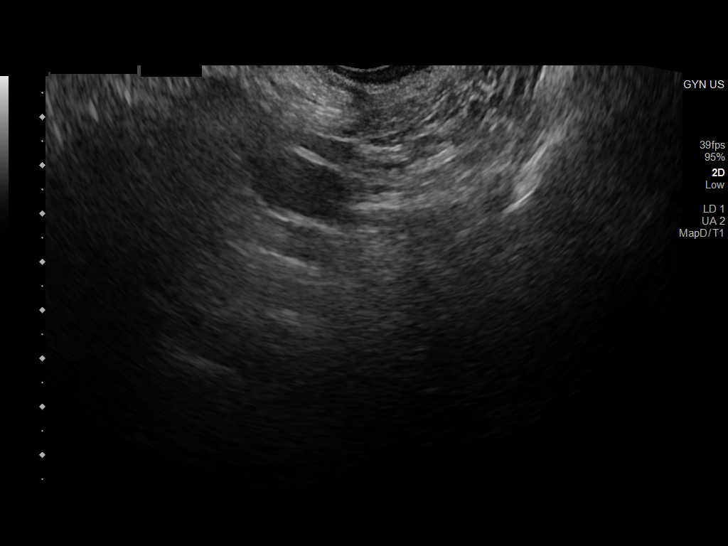

[14 of 25 positions shown; findings below may reference images not displayed]

FINDINGS: Uterus

Measurements: 9.4 x 3.7 x 5.2 cm = volume: 94.1 mL. No fibroids or
other mass visualized.

Endometrium

Thickness: 7 mm.  No focal abnormality visualized.

Right ovary

Measurements: 3.8 x 3.3 x 2.8 cm = volume: 18.5 mL. Normal
appearance/no adnexal mass.

Left ovary

Measurements: 3.5 x 2.4 x 2.7 cm = volume: 11.5 mL. Normal
appearance/no adnexal mass.

Pulsed Doppler evaluation of both ovaries demonstrates normal
low-resistance arterial and venous waveforms.

Other findings

No abnormal free fluid.
IMPRESSION: Study within normal limits. No intrauterine or extrauterine pelvic
or adnexal mass. Low resistance waveform in each ovary without
evidence of torsion on either side. No free pelvic fluid.
